# Patient Record
Sex: Female | Born: 1960
Health system: Southern US, Community
[De-identification: ages and names within clinical notes are randomized; demographics above are authoritative.]

## PROBLEM LIST (undated history)

## (undated) DIAGNOSIS — B009 Herpesviral infection, unspecified: Secondary | ICD-10-CM

## (undated) DIAGNOSIS — C44310 Basal cell carcinoma of skin of unspecified parts of face: Secondary | ICD-10-CM

## (undated) DIAGNOSIS — C4491 Basal cell carcinoma of skin, unspecified: Secondary | ICD-10-CM

## (undated) HISTORY — DX: Herpesviral infection, unspecified: B00.9

## (undated) HISTORY — DX: Basal cell carcinoma of skin, unspecified: C44.91

## (undated) HISTORY — DX: Basal cell carcinoma of skin of unspecified parts of face: C44.310

## (undated) HISTORY — PX: WISDOM TOOTH EXTRACTION: SHX21

## (undated) HISTORY — PX: TONSILLECTOMY AND ADENOIDECTOMY: SHX28

---

## 1997-09-24 ENCOUNTER — Ambulatory Visit (HOSPITAL_COMMUNITY): Admission: RE | Admit: 1997-09-24 | Discharge: 1997-09-24 | Payer: Self-pay | Admitting: Obstetrics and Gynecology

## 1999-04-06 ENCOUNTER — Other Ambulatory Visit: Admission: RE | Admit: 1999-04-06 | Discharge: 1999-04-06 | Payer: Self-pay | Admitting: Obstetrics and Gynecology

## 1999-10-07 ENCOUNTER — Encounter: Payer: Self-pay | Admitting: Obstetrics and Gynecology

## 1999-10-07 ENCOUNTER — Encounter: Admission: RE | Admit: 1999-10-07 | Discharge: 1999-10-07 | Payer: Self-pay | Admitting: Obstetrics and Gynecology

## 2000-04-28 ENCOUNTER — Other Ambulatory Visit: Admission: RE | Admit: 2000-04-28 | Discharge: 2000-04-28 | Payer: Self-pay | Admitting: Obstetrics and Gynecology

## 2000-10-12 ENCOUNTER — Other Ambulatory Visit: Admission: RE | Admit: 2000-10-12 | Discharge: 2000-10-12 | Payer: Self-pay | Admitting: Obstetrics and Gynecology

## 2001-05-05 ENCOUNTER — Other Ambulatory Visit: Admission: RE | Admit: 2001-05-05 | Discharge: 2001-05-05 | Payer: Self-pay | Admitting: Obstetrics and Gynecology

## 2001-08-16 ENCOUNTER — Encounter: Admission: RE | Admit: 2001-08-16 | Discharge: 2001-08-16 | Payer: Self-pay | Admitting: Obstetrics and Gynecology

## 2001-08-16 ENCOUNTER — Encounter: Payer: Self-pay | Admitting: Obstetrics and Gynecology

## 2002-05-23 ENCOUNTER — Other Ambulatory Visit: Admission: RE | Admit: 2002-05-23 | Discharge: 2002-05-23 | Payer: Self-pay | Admitting: Obstetrics and Gynecology

## 2002-05-31 ENCOUNTER — Encounter: Payer: Self-pay | Admitting: Obstetrics and Gynecology

## 2002-05-31 ENCOUNTER — Encounter: Admission: RE | Admit: 2002-05-31 | Discharge: 2002-05-31 | Payer: Self-pay | Admitting: Obstetrics and Gynecology

## 2003-06-04 ENCOUNTER — Other Ambulatory Visit: Admission: RE | Admit: 2003-06-04 | Discharge: 2003-06-04 | Payer: Self-pay | Admitting: Obstetrics and Gynecology

## 2003-06-18 ENCOUNTER — Encounter: Admission: RE | Admit: 2003-06-18 | Discharge: 2003-06-18 | Payer: Self-pay | Admitting: Obstetrics and Gynecology

## 2004-04-14 ENCOUNTER — Other Ambulatory Visit: Admission: RE | Admit: 2004-04-14 | Discharge: 2004-04-14 | Payer: Self-pay | Admitting: Obstetrics and Gynecology

## 2004-06-18 ENCOUNTER — Encounter: Admission: RE | Admit: 2004-06-18 | Discharge: 2004-06-18 | Payer: Self-pay | Admitting: Obstetrics and Gynecology

## 2004-07-03 ENCOUNTER — Encounter: Admission: RE | Admit: 2004-07-03 | Discharge: 2004-07-03 | Payer: Self-pay | Admitting: Obstetrics and Gynecology

## 2004-12-15 ENCOUNTER — Other Ambulatory Visit: Admission: RE | Admit: 2004-12-15 | Discharge: 2004-12-15 | Payer: Self-pay | Admitting: Obstetrics and Gynecology

## 2005-06-22 ENCOUNTER — Encounter: Admission: RE | Admit: 2005-06-22 | Discharge: 2005-06-22 | Payer: Self-pay | Admitting: *Deleted

## 2006-01-21 ENCOUNTER — Other Ambulatory Visit: Admission: RE | Admit: 2006-01-21 | Discharge: 2006-01-21 | Payer: Self-pay | Admitting: Obstetrics & Gynecology

## 2006-06-23 ENCOUNTER — Encounter: Admission: RE | Admit: 2006-06-23 | Discharge: 2006-06-23 | Payer: Self-pay | Admitting: Obstetrics and Gynecology

## 2006-09-27 HISTORY — PX: ENDOMETRIAL ABLATION: SHX621

## 2007-02-23 ENCOUNTER — Other Ambulatory Visit: Admission: RE | Admit: 2007-02-23 | Discharge: 2007-02-23 | Payer: Self-pay | Admitting: Obstetrics and Gynecology

## 2007-05-10 ENCOUNTER — Encounter: Admission: RE | Admit: 2007-05-10 | Discharge: 2007-05-10 | Payer: Self-pay | Admitting: Obstetrics and Gynecology

## 2008-03-14 ENCOUNTER — Other Ambulatory Visit: Admission: RE | Admit: 2008-03-14 | Discharge: 2008-03-14 | Payer: Self-pay | Admitting: Obstetrics and Gynecology

## 2008-05-10 ENCOUNTER — Encounter: Admission: RE | Admit: 2008-05-10 | Discharge: 2008-05-10 | Payer: Self-pay | Admitting: Obstetrics and Gynecology

## 2009-05-13 ENCOUNTER — Encounter: Admission: RE | Admit: 2009-05-13 | Discharge: 2009-05-13 | Payer: Self-pay | Admitting: Obstetrics and Gynecology

## 2010-04-30 ENCOUNTER — Encounter: Admission: RE | Admit: 2010-04-30 | Discharge: 2010-04-30 | Payer: Self-pay | Admitting: Obstetrics & Gynecology

## 2010-07-18 ENCOUNTER — Encounter: Payer: Self-pay | Admitting: Obstetrics and Gynecology

## 2011-03-31 ENCOUNTER — Other Ambulatory Visit: Payer: Self-pay | Admitting: Obstetrics & Gynecology

## 2011-03-31 DIAGNOSIS — Z1231 Encounter for screening mammogram for malignant neoplasm of breast: Secondary | ICD-10-CM

## 2011-05-03 ENCOUNTER — Ambulatory Visit
Admission: RE | Admit: 2011-05-03 | Discharge: 2011-05-03 | Disposition: A | Discharge status: Home / Self Care | Payer: 59 | Source: Ambulatory Visit | Attending: Obstetrics & Gynecology | Admitting: Obstetrics & Gynecology

## 2011-05-03 DIAGNOSIS — Z1231 Encounter for screening mammogram for malignant neoplasm of breast: Secondary | ICD-10-CM

## 2011-09-27 HISTORY — PX: MOHS SURGERY: SUR867

## 2011-10-17 ENCOUNTER — Ambulatory Visit (INDEPENDENT_AMBULATORY_CARE_PROVIDER_SITE_OTHER): Payer: 59 | Admitting: Family Medicine

## 2011-10-17 VITALS — BP 107/72 | HR 83 | Temp 98.5°F | Resp 16 | Ht 63.0 in | Wt 121.0 lb

## 2011-10-17 DIAGNOSIS — H1031 Unspecified acute conjunctivitis, right eye: Secondary | ICD-10-CM

## 2011-10-17 DIAGNOSIS — H66009 Acute suppurative otitis media without spontaneous rupture of ear drum, unspecified ear: Secondary | ICD-10-CM

## 2011-10-17 MED ORDER — TOBRAMYCIN 0.3 % OP SOLN: 1.0000 [drp] | Freq: Four times a day (QID) | OPHTHALMIC | Status: AC

## 2011-10-17 NOTE — Patient Instructions (Signed)
Conjunctivitis Conjunctivitis is commonly called "pink eye." Conjunctivitis can be caused by bacterial or viral infection, allergies, or injuries. There is usually redness of the lining of the eye, itching, discomfort, and sometimes discharge. There may be deposits of matter along the eyelids. A viral infection usually causes a watery discharge, while a bacterial infection causes a yellowish, thick discharge. Pink eye is very contagious and spreads by direct contact. You may be given antibiotic eyedrops as part of your treatment. Before using your eye medicine, remove all drainage from the eye by washing gently with warm water and cotton balls. Continue to use the medication until you have awakened 2 mornings in a row without discharge from the eye. Do not rub your eye. This increases the irritation and helps spread infection. Use separate towels from other household members. Wash your hands with soap and water before and after touching your eyes. Use cold compresses to reduce pain and sunglasses to relieve irritation from light. Do not wear contact lenses or wear eye makeup until the infection is gone. SEEK MEDICAL CARE IF:   Your symptoms are not better after 3 days of treatment.   You have increased pain or trouble seeing.   The outer eyelids become very red or swollen.  Document Released: 07/22/2004 Document Revised: 06/03/2011 Document Reviewed: 06/14/2005 ExitCare Patient Information 2012 [employer]. 

## 2011-10-17 NOTE — Progress Notes (Signed)
51 yo woman in [facility] with contact lens missing and gritty sensation in right eye.  O:  Injected right scleral vessels without perilimbal predominance No contact: lid everted. EOMI PERLA  A: conjunctivitis, OD  P: tobrex tid x 3-5 days OD

## 2011-10-20 LAB — HM PAP SMEAR: HM Pap smear: NEGATIVE

## 2012-04-03 ENCOUNTER — Other Ambulatory Visit: Payer: Self-pay | Admitting: Obstetrics & Gynecology

## 2012-04-03 DIAGNOSIS — Z1231 Encounter for screening mammogram for malignant neoplasm of breast: Secondary | ICD-10-CM

## 2012-05-05 ENCOUNTER — Ambulatory Visit
Admission: RE | Admit: 2012-05-05 | Discharge: 2012-05-05 | Disposition: A | Discharge status: Home / Self Care | Payer: 59 | Source: Ambulatory Visit | Attending: Obstetrics & Gynecology | Admitting: Obstetrics & Gynecology

## 2012-05-05 DIAGNOSIS — Z1231 Encounter for screening mammogram for malignant neoplasm of breast: Secondary | ICD-10-CM

## 2012-11-08 ENCOUNTER — Ambulatory Visit: Payer: 59 | Admitting: Obstetrics & Gynecology

## 2012-11-10 ENCOUNTER — Encounter: Payer: Self-pay | Admitting: Obstetrics & Gynecology

## 2012-11-10 ENCOUNTER — Ambulatory Visit (INDEPENDENT_AMBULATORY_CARE_PROVIDER_SITE_OTHER): Payer: 59 | Admitting: Obstetrics & Gynecology

## 2012-11-10 VITALS — BP 110/64 | HR 76 | Ht 63.25 in | Wt 117.0 lb

## 2012-11-10 DIAGNOSIS — N951 Menopausal and female climacteric states: Secondary | ICD-10-CM

## 2012-11-10 DIAGNOSIS — Z0000 Encounter for general adult medical examination without abnormal findings: Secondary | ICD-10-CM

## 2012-11-10 DIAGNOSIS — Z01419 Encounter for gynecological examination (general) (routine) without abnormal findings: Secondary | ICD-10-CM

## 2012-11-10 LAB — POCT URINALYSIS DIPSTICK
Bilirubin, UA: NEGATIVE
Blood, UA: NEGATIVE
Glucose, UA: NEGATIVE
Ketones, UA: NEGATIVE
Leukocytes, UA: NEGATIVE
Nitrite, UA: NEGATIVE
Protein, UA: NEGATIVE
Urobilinogen, UA: NEGATIVE
pH, UA: 7

## 2012-11-10 LAB — FOLLICLE STIMULATING HORMONE: FSH: 12.4 m[IU]/mL

## 2012-11-10 NOTE — Patient Instructions (Signed)

## 2012-11-10 NOTE — Progress Notes (Signed)
Patient ID: Christina Wallace, female   DOB: 1961-02-01, 52 y.o.   MRN: [id number]  52 y.o. [id number] MarriedCaucasianF here for annual exam.  Not happy with her OCPs.  Frustrated with cost.  Hospital doctor from Loestrin 24 to [facility] [facility].  Having more trouble with breast cyst on Minastrin.  Has been on this six months.  No vaginal bleeding just frustrating spotting.  No LMP recorded. Patient has had an ablation.          Sexually active: yes  The current method of family planning is vasectomy.    Exercising: yes  walking/elliptical Smoker:  no  Health Maintenance: Pap:  10/20/11 History of abnormal Pap:  yes MMG:  05/05/12, 3D Colonoscopy:  11/06/12, negative with Dr. Loreta Ave, f/u 10 yrs BMD:   never TDaP:  ? 2010 Screening Labs: Dr. Loreta Ave, Hb today: Dr. Loreta Ave, Urine today: negative    reports that she has never smoked. She has never used smokeless tobacco. She reports that she drinks about 1.8 ounces of alcohol per week. She reports that she does not use illicit drugs.  Past Medical History  Diagnosis Date  . BCC (basal cell carcinoma), face     forehead, stomach, legs, shoulders, arm    Past Surgical History  Procedure Laterality Date  . Endometrial ablation  4/08  . Tonsillectomy and adenoidectomy      Current Outpatient Prescriptions  Medication Sig Dispense Refill  . norethindrone-ethinyl estradiol (JUNEL FE,GILDESS FE,LOESTRIN FE) 1-20 MG-MCG tablet Take 1 tablet by mouth daily.      . Calcium Carbonate-Vitamin D (CALCIUM + D PO) Take 1 tablet by mouth daily.      . cetirizine (ZYRTEC) 10 MG tablet Take 10 mg by mouth daily.       No current facility-administered medications for this visit.    Family History  Problem Relation Age of Onset  . Breast cancer Mother 62  . Diabetes Mother     type II, AODM  . Colon polyps Father   . Thyroid disease Father   . Hypertension Father     ROS:  Pertinent items are noted in HPI.  Otherwise, a comprehensive ROS was negative.  Exam:   BP 110/64   Pulse 76  Ht 5' 3.25" (1.607 m)  Wt 117 lb (53.071 kg)  BMI 20.55 kg/m2  Weight change: -3lbs Height:   Height: 5' 3.25" (160.7 cm)  Ht Readings from Last 3 Encounters:  11/10/12 5' 3.25" (1.607 m)  10/17/11 5\' 3"  (1.6 m)    General appearance: alert, cooperative and appears stated age Head: Normocephalic, without obvious abnormality, atraumatic Neck: no adenopathy, supple, symmetrical, trachea midline and thyroid normal to inspection and palpation Lungs: clear to auscultation bilaterally Breasts: normal appearance, no masses or tenderness Heart: regular rate and rhythm Abdomen: soft, non-tender; bowel sounds normal; no masses,  no organomegaly Extremities: extremities normal, atraumatic, no cyanosis or edema Skin: Skin color, texture, turgor normal. No rashes or lesions Lymph nodes: Cervical, supraclavicular, and axillary nodes normal. No abnormal inguinal nodes palpated Neurologic: Grossly normal   Pelvic: External genitalia:  no lesions              Urethra:  normal appearing urethra with no masses, tenderness or lesions              Bartholins and Skenes: normal                 Vagina: normal appearing vagina with normal color and discharge, no lesions  Cervix: no lesions              Pap taken: no Bimanual Exam:  Uterus:  normal size, contour, position, consistency, mobility, non-tender              Adnexa: normal adnexa and no mass, fullness, tenderness               Rectovaginal: Confirms               Anus:  normal sphincter tone, no lesions  A:  Well Woman with normal exam BCC H/O breast cysts DUB, was better on Loestrin 24 but not on Minastrin  P:   Mammogram yearly.  3D MMG discussed pap smear with neg HR HPV 4/13.  No Pap today FSH today.  If low, will change OCPs to Loestrin 1/20 return annually or prn  An After Visit Summary was printed and given to the patient.

## 2012-11-13 ENCOUNTER — Other Ambulatory Visit: Payer: Self-pay

## 2012-11-13 MED ORDER — NORETHINDRONE ACET-ETHINYL EST 1-20 MG-MCG PO TABS: 1.0000 | ORAL_TABLET | Freq: Every day | ORAL | Status: DC

## 2013-04-06 ENCOUNTER — Other Ambulatory Visit: Payer: Self-pay

## 2013-04-06 DIAGNOSIS — Z1231 Encounter for screening mammogram for malignant neoplasm of breast: Secondary | ICD-10-CM

## 2013-05-03 ENCOUNTER — Other Ambulatory Visit: Payer: Self-pay

## 2013-05-07 ENCOUNTER — Ambulatory Visit
Admission: RE | Admit: 2013-05-07 | Discharge: 2013-05-07 | Disposition: A | Discharge status: Home / Self Care | Payer: Self-pay | Source: Ambulatory Visit

## 2013-05-07 DIAGNOSIS — Z1231 Encounter for screening mammogram for malignant neoplasm of breast: Secondary | ICD-10-CM

## 2013-05-10 ENCOUNTER — Encounter: Payer: Self-pay | Admitting: Obstetrics & Gynecology

## 2013-05-11 NOTE — Telephone Encounter (Signed)
05/11/13 lmtcb//kn

## 2013-05-11 NOTE — Telephone Encounter (Signed)
Message copied by Elisha Headland on Fri May 11, 2013  4:45 PM ------      Message from: Jerene Bears      Created: Fri May 11, 2013  3:55 PM       Daughter's name is Annabelle Harman.  Number is [phone] ------

## 2013-10-08 ENCOUNTER — Encounter: Payer: Self-pay | Admitting: Obstetrics & Gynecology

## 2013-10-08 MED ORDER — NORETHIN ACE-ETH ESTRAD-FE 1-20 MG-MCG PO TABS: 1.0000 | ORAL_TABLET | Freq: Every day | ORAL | Status: DC

## 2013-10-08 NOTE — Telephone Encounter (Signed)
Rx for RF after MyChart message was sent by patient.

## 2013-11-23 ENCOUNTER — Ambulatory Visit (INDEPENDENT_AMBULATORY_CARE_PROVIDER_SITE_OTHER): Payer: 59 | Admitting: Obstetrics & Gynecology

## 2013-11-23 ENCOUNTER — Encounter: Payer: Self-pay | Admitting: Obstetrics & Gynecology

## 2013-11-23 ENCOUNTER — Telehealth: Payer: Self-pay | Admitting: Obstetrics & Gynecology

## 2013-11-23 VITALS — BP 100/60 | HR 64 | Resp 16 | Ht 63.5 in | Wt 119.2 lb

## 2013-11-23 DIAGNOSIS — N8000 Endometriosis of the uterus, unspecified: Secondary | ICD-10-CM

## 2013-11-23 DIAGNOSIS — N809 Endometriosis, unspecified: Secondary | ICD-10-CM

## 2013-11-23 DIAGNOSIS — N8003 Adenomyosis of the uterus: Secondary | ICD-10-CM

## 2013-11-23 DIAGNOSIS — R6889 Other general symptoms and signs: Secondary | ICD-10-CM

## 2013-11-23 DIAGNOSIS — Z124 Encounter for screening for malignant neoplasm of cervix: Secondary | ICD-10-CM

## 2013-11-23 DIAGNOSIS — Z01419 Encounter for gynecological examination (general) (routine) without abnormal findings: Secondary | ICD-10-CM

## 2013-11-23 DIAGNOSIS — Z0000 Encounter for general adult medical examination without abnormal findings: Secondary | ICD-10-CM

## 2013-11-23 DIAGNOSIS — R899 Unspecified abnormal finding in specimens from other organs, systems and tissues: Secondary | ICD-10-CM

## 2013-11-23 LAB — HEMOGLOBIN, FINGERSTICK: Hemoglobin, fingerstick: 13.1 g/dL (ref 12.0–16.0)

## 2013-11-23 LAB — POCT URINALYSIS DIPSTICK
Bilirubin, UA: NEGATIVE
Blood, UA: NEGATIVE
Glucose, UA: NEGATIVE
Ketones, UA: NEGATIVE
Nitrite, UA: NEGATIVE
Protein, UA: NEGATIVE
Urobilinogen, UA: NEGATIVE
pH, UA: 6

## 2013-11-23 MED ORDER — CLINDAMYCIN PHOSPHATE 2 % VA CREA: TOPICAL_CREAM | VAGINAL | Status: DC

## 2013-11-23 MED ORDER — NORETHIN ACE-ETH ESTRAD-FE 1-20 MG-MCG PO TABS: 1.0000 | ORAL_TABLET | Freq: Every day | ORAL | Status: DC

## 2013-11-23 NOTE — Telephone Encounter (Signed)
Patient is calling sabrina back °

## 2013-11-23 NOTE — Progress Notes (Signed)
53 y.o. [name] MarriedCaucasianF here for annual exam.  Planning on another trip to see [place] this [place].  Trying to see them all with her kids.  Doing well.  Still on OCPs for cycle control.  Bleeds very rarely.  Is irregular.    Pt with chronic issues with vulvar irritation.  Had vulvar biopsy 7/06.  Negative for abnormal cells.  Only thing that has every helped is Clindamycin cream.  Would like to try again.    Patient's last menstrual period was 08/26/2013.          Sexually active: yes  The current method of family planning is vasectomy and OCP (estrogen/progesterone).    Exercising: yes   Smoker:  no  Health Maintenance: Pap:  10/20/11 WNL/negative HR HPV History of abnormal Pap:  yes MMG:  05/07/13-normal Colonoscopy:  2014-repeat in 10 years BMD:   none TDaP:  2010 Screening Labs: today, Hb today: 13.1, Urine today: WBC-trace, PH-6.0   reports that she has never smoked. She has never used smokeless tobacco. She reports that she drinks about 3 ounces of alcohol per week. She reports that she does not use illicit drugs.  Past Medical History  Diagnosis Date  . BCC (basal cell carcinoma), face     forehead, stomach, legs, shoulders, arm  . BCC (basal cell carcinoma)     several more areas removed    Past Surgical History  Procedure Laterality Date  . Endometrial ablation  4/08  . Tonsillectomy and adenoidectomy    . Mohs surgery  4/13    BCC on forehead  . Cesarean section  1/91  . Wisdom tooth extraction      Current Outpatient Prescriptions  Medication Sig Dispense Refill  . Cholecalciferol (VITAMIN D PO) Take by mouth daily.      . norethindrone-ethinyl estradiol (JUNEL FE,GILDESS FE,LOESTRIN FE) 1-20 MG-MCG tablet Take 1 tablet by mouth daily.  1 Package  2  . cetirizine (ZYRTEC) 10 MG tablet Take 10 mg by mouth daily.       No current facility-administered medications for this visit.    Family History  Problem Relation Age of Onset  . Breast cancer  Mother 30  . Diabetes Mother     type II, AODM  . Colon polyps Father   . Thyroid disease Father   . Hypertension Father     ROS:  Pertinent items are noted in HPI.  Otherwise, a comprehensive ROS was negative.  Exam:   BP 100/60  Pulse 64  Resp 16  Ht 5' 3.5" (1.613 m)  Wt 119 lb 3.2 oz (54.069 kg)  BMI 20.78 kg/m2  LMP 08/26/2013   Height: 5' 3.5" (161.3 cm)  Ht Readings from Last 3 Encounters:  11/23/13 5' 3.5" (1.613 m)  11/10/12 5' 3.25" (1.607 m)  10/17/11 5\' 3"  (1.6 m)    General appearance: alert, cooperative and appears stated age Head: Normocephalic, without obvious abnormality, atraumatic Neck: no adenopathy, supple, symmetrical, trachea midline and thyroid normal to inspection and palpation Lungs: clear to auscultation bilaterally Breasts: normal appearance, no masses or tenderness Heart: regular rate and rhythm Abdomen: soft, non-tender; bowel sounds normal; no masses,  no organomegaly Extremities: extremities normal, atraumatic, no cyanosis or edema Skin: Skin color, texture, turgor normal. No rashes or lesions Lymph nodes: Cervical, supraclavicular, and axillary nodes normal. No abnormal inguinal nodes palpated Neurologic: Grossly normal   Pelvic: External genitalia:  no lesions  Urethra:  normal appearing urethra with no masses, tenderness or lesions              Bartholins and Skenes: normal                 Vagina: normal appearing vagina with normal color and discharge, no lesions              Cervix: no lesions              Pap taken: yes Bimanual Exam:  Uterus:  normal size, contour, position, consistency, mobility, non-tender              Adnexa: normal adnexa and no mass, fullness, tenderness               Rectovaginal: Confirms               Anus:  normal sphincter tone, no lesions  A:  Well Woman with normal exam Low Vit D Failed endometrial ablation H/O BCC.  Seeing derm every 6 months DUB  P:   Mammogram yearly.  D/W pt  doing 3D MMG yearly. pap smear only.  Neg HR HPV 4/13 with neg Pap. Trial with clindamycin cream topically.  Rx to pharmacy.  Pt to call and give update in 10-14 days. Rx for Alesse #3 mo supply/4RF Plan PUS.  If endometrium thickened, do endometrial biopsy return annually or prn  An After Visit Summary was printed and given to the patient.

## 2013-11-23 NOTE — Patient Instructions (Signed)

## 2013-11-23 NOTE — Telephone Encounter (Signed)
Left message for patient to call back. Need to go over benefits and schedule PUS °

## 2013-11-23 NOTE — Telephone Encounter (Signed)
Spoke with patient. Advised that per benefit quote received, she will be responsible for $20 copay for PUS. Scheduled PUS. Advised patient of 72 hour cancellation policy and $594 cancellation fee. Patient agreeable.

## 2013-11-24 LAB — TSH: TSH: 4.536 u[IU]/mL — ABNORMAL HIGH (ref 0.350–4.500)

## 2013-11-24 LAB — COMPREHENSIVE METABOLIC PANEL
ALT: 12 U/L (ref 0–35)
AST: 16 U/L (ref 0–37)
Albumin: 4.2 g/dL (ref 3.5–5.2)
Alkaline Phosphatase: 44 U/L (ref 39–117)
BUN: 12 mg/dL (ref 6–23)
CO2: 26 mEq/L (ref 19–32)
Calcium: 9.4 mg/dL (ref 8.4–10.5)
Chloride: 104 mEq/L (ref 96–112)
Creat: 0.68 mg/dL (ref 0.50–1.10)
Glucose, Bld: 79 mg/dL (ref 70–99)
Potassium: 4.5 mEq/L (ref 3.5–5.3)
Sodium: 138 mEq/L (ref 135–145)
Total Bilirubin: 0.5 mg/dL (ref 0.2–1.2)
Total Protein: 6.5 g/dL (ref 6.0–8.3)

## 2013-11-24 LAB — LIPID PANEL
Cholesterol: 161 mg/dL (ref 0–200)
HDL: 61 mg/dL (ref >39)
LDL Cholesterol: 87 mg/dL (ref 0–99)
Total CHOL/HDL Ratio: 2.6 Ratio
Triglycerides: 67 mg/dL (ref <150)
VLDL: 13 mg/dL (ref 0–40)

## 2013-11-24 LAB — VITAMIN D 25 HYDROXY (VIT D DEFICIENCY, FRACTURES): Vit D, 25-Hydroxy: 45 ng/mL (ref 30–89)

## 2013-11-25 ENCOUNTER — Encounter: Payer: Self-pay | Admitting: Obstetrics & Gynecology

## 2013-11-27 ENCOUNTER — Telehealth: Payer: Self-pay

## 2013-11-27 LAB — IPS PAP SMEAR ONLY

## 2013-11-27 NOTE — Telephone Encounter (Signed)
Message copied by Robley Fries on Tue Nov 27, 2013  8:46 AM ------      Message from: Megan Salon      Created: Tue Nov 27, 2013  7:28 AM       Can you call pt and make sure she has a TSH scheduled?  Thanks.            Mrs. Mac,      Your lipids are find.  Your metabolic panel is normal--kidney and liver function, glucose, and electrolytes.  Your Vit D is normal.  Your thyroid test is mildly elevated.  It should be repeated in a month.  My nurse, Claiborne Billings, will call you about this.            Dr. Sabra Heck ------

## 2013-11-27 NOTE — Addendum Note (Signed)
Addended by: Megan Salon on: 11/27/2013 07:29 AM   Modules accepted: Orders

## 2013-11-27 NOTE — Telephone Encounter (Signed)
Left detailed message for patient to call back to discuss labs and make f/u appointment. She does have [place] with Dr  Sabra Heck on 11/29/13 and can scheduled then if she would like//kn

## 2013-11-29 ENCOUNTER — Ambulatory Visit (INDEPENDENT_AMBULATORY_CARE_PROVIDER_SITE_OTHER): Payer: 59 | Admitting: Obstetrics & Gynecology

## 2013-11-29 ENCOUNTER — Ambulatory Visit (INDEPENDENT_AMBULATORY_CARE_PROVIDER_SITE_OTHER): Payer: 59

## 2013-11-29 VITALS — BP 118/78 | Ht 63.5 in | Wt 119.0 lb

## 2013-11-29 DIAGNOSIS — N925 Other specified irregular menstruation: Secondary | ICD-10-CM

## 2013-11-29 DIAGNOSIS — D259 Leiomyoma of uterus, unspecified: Secondary | ICD-10-CM

## 2013-11-29 DIAGNOSIS — N8000 Endometriosis of the uterus, unspecified: Secondary | ICD-10-CM

## 2013-11-29 DIAGNOSIS — D219 Benign neoplasm of connective and other soft tissue, unspecified: Secondary | ICD-10-CM

## 2013-11-29 DIAGNOSIS — N938 Other specified abnormal uterine and vaginal bleeding: Secondary | ICD-10-CM

## 2013-11-29 DIAGNOSIS — N8003 Adenomyosis of the uterus: Secondary | ICD-10-CM

## 2013-11-29 DIAGNOSIS — N809 Endometriosis, unspecified: Principal | ICD-10-CM

## 2013-11-29 DIAGNOSIS — N83209 Unspecified ovarian cyst, unspecified side: Secondary | ICD-10-CM

## 2013-11-29 DIAGNOSIS — N949 Unspecified condition associated with female genital organs and menstrual cycle: Secondary | ICD-10-CM

## 2013-11-29 NOTE — Progress Notes (Signed)
53 y.o. Christina Wallace Marriedfemale here for a pelvic ultrasound due to DUB in pt with hx of failed endometrial ablation.  She is on OCPs and does bleed from time to time.  This is not always when she is on placebo pills.  It is usually light when she does bleed.  Pt has been contemplating hysterectomy.  She does want her ovaries removed so doesn't want to do it now if possible.  We have talked about this several times over the past few years.  Patient's last menstrual period was 08/26/2013.  Sexually active:  yes  Contraception: oral contraceptives (estrogen/progesterone)  FINDINGS: UTERUS:  7.4 x 5.6 x 3.5cm with 1.0cm intramural fibroid, posterior EMS: 3.104mm, slightly distored ADNEXA:   Left ovary 3.0 x 2.6 x 1.7cm   Right ovary 2.2 x 1.6 x 1.5cm with 1.9 x 1.7 x 2.0cm cyst which appears to be a corpus luteal cyst CUL DE SAC: no free fluid  Findings reviewed with patient.  Images reviewed as well.  Pt not interested in proceeding with any surgery right now.  All questions answered.  Assessment:  DUB, probable adenomyosis, corpus luteal cyst, slightly irregular endometrium with hx of endometrial ablation Plan: Repeat physical exam next year.  Pt to call with any changes in bleeding.  Pt voices understanding.  ~15 minutes spent with patient >50% of time was in face to face discussion of above.

## 2013-12-05 ENCOUNTER — Encounter: Payer: Self-pay | Admitting: Obstetrics & Gynecology

## 2013-12-05 DIAGNOSIS — N938 Other specified abnormal uterine and vaginal bleeding: Secondary | ICD-10-CM | POA: Insufficient documentation

## 2013-12-10 NOTE — Telephone Encounter (Signed)
Patient has recheck lab appointment//kn

## 2013-12-31 ENCOUNTER — Other Ambulatory Visit (INDEPENDENT_AMBULATORY_CARE_PROVIDER_SITE_OTHER): Payer: 59

## 2013-12-31 DIAGNOSIS — R6889 Other general symptoms and signs: Secondary | ICD-10-CM

## 2013-12-31 DIAGNOSIS — R899 Unspecified abnormal finding in specimens from other organs, systems and tissues: Secondary | ICD-10-CM

## 2014-01-01 LAB — TSH: TSH: 3.616 u[IU]/mL (ref 0.350–4.500)

## 2014-04-15 ENCOUNTER — Other Ambulatory Visit: Payer: Self-pay

## 2014-04-15 DIAGNOSIS — Z1239 Encounter for other screening for malignant neoplasm of breast: Secondary | ICD-10-CM

## 2014-04-29 ENCOUNTER — Encounter: Payer: Self-pay | Admitting: Obstetrics & Gynecology

## 2014-05-10 ENCOUNTER — Other Ambulatory Visit: Payer: Self-pay

## 2014-05-10 ENCOUNTER — Ambulatory Visit
Admission: RE | Admit: 2014-05-10 | Discharge: 2014-05-10 | Disposition: A | Discharge status: Home / Self Care | Payer: 59 | Source: Ambulatory Visit

## 2014-05-10 DIAGNOSIS — Z1231 Encounter for screening mammogram for malignant neoplasm of breast: Secondary | ICD-10-CM

## 2014-09-09 ENCOUNTER — Encounter: Payer: Self-pay | Admitting: Obstetrics & Gynecology

## 2014-09-16 ENCOUNTER — Encounter: Payer: Self-pay | Admitting: Obstetrics & Gynecology

## 2014-11-25 ENCOUNTER — Other Ambulatory Visit: Payer: Self-pay | Admitting: Obstetrics & Gynecology

## 2014-11-26 NOTE — Telephone Encounter (Signed)
Medication refill request: Junel  Last AEX:  11/23/13  Next AEX: 12/02/14 SM Last MMG (if hormonal medication request): 05/10/14 BIRADS1:Neg Refill authorized: 11/23/13 #3packs w/ 4R. Today #1pack w/0R?

## 2014-12-02 ENCOUNTER — Ambulatory Visit (INDEPENDENT_AMBULATORY_CARE_PROVIDER_SITE_OTHER): Payer: 59 | Admitting: Obstetrics & Gynecology

## 2014-12-02 ENCOUNTER — Encounter: Payer: Self-pay | Admitting: Obstetrics & Gynecology

## 2014-12-02 VITALS — BP 110/70 | HR 84 | Resp 14 | Ht 63.25 in | Wt 120.0 lb

## 2014-12-02 DIAGNOSIS — Z01419 Encounter for gynecological examination (general) (routine) without abnormal findings: Secondary | ICD-10-CM

## 2014-12-02 DIAGNOSIS — N951 Menopausal and female climacteric states: Secondary | ICD-10-CM | POA: Diagnosis not present

## 2014-12-02 DIAGNOSIS — Z0000 Encounter for general adult medical examination without abnormal findings: Secondary | ICD-10-CM

## 2014-12-02 LAB — POCT URINALYSIS DIPSTICK
Bilirubin, UA: NEGATIVE
Blood, UA: NEGATIVE
Glucose, UA: NEGATIVE
Ketones, UA: NEGATIVE
Leukocytes, UA: NEGATIVE
Nitrite, UA: NEGATIVE
Protein, UA: NEGATIVE
Spec Grav, UA: 1.02
Urobilinogen, UA: NEGATIVE
pH, UA: 6.5

## 2014-12-02 LAB — HEMOGLOBIN, FINGERSTICK: Hemoglobin, fingerstick: 13.3 g/dL (ref 12.0–16.0)

## 2014-12-02 LAB — TSH: TSH: 3.881 u[IU]/mL (ref 0.350–4.500)

## 2014-12-02 MED ORDER — NORETHIN ACE-ETH ESTRAD-FE 1-20 MG-MCG PO TABS: 1.0000 | ORAL_TABLET | Freq: Every day | ORAL | Status: DC

## 2014-12-02 NOTE — Progress Notes (Signed)
Patient ID: Christina Wallace, female   DOB: 1960-07-21, 54 y.o.   MRN: [id number]   54 y.o. [id number] MarriedCaucasianF here for annual exam.  Has minimal spotting about every six months now.  Planning on testing [facility] today.    Dermatologist:  Planning on seeing new one in [place].  Going every six months.    Daughter getting married in September.  PCP:  None   No LMP recorded. Patient has had an ablation.          Sexually active: Yes.    The current method of family planning is vasectomy and ablation.    Exercising: Yes.    walking and eltipitcal Smoker:  no  Health Maintenance: Pap:  11-23-13 WNL, 2013 neg HR HPV History of abnormal Pap:  Yes  MMG:  05-10-14 WNL BI RADS 1 Colonoscopy:  11-06-12 WNL Repeat in 10 yrs BMD:   Never TDaP:  06-28-08 Screening Labs: Labs drawn today, Hb today: 13.3, Urine today: WNL   reports that she has never smoked. She has never used smokeless tobacco. She reports that she drinks about 3.0 oz of alcohol per week. She reports that she does not use illicit drugs.  Past Medical History  Diagnosis Date  . BCC (basal cell carcinoma), face     forehead, stomach, legs, shoulders, arm  . BCC (basal cell carcinoma)     several more areas removed  . HSV-1 infection     Past Surgical History  Procedure Laterality Date  . Endometrial ablation  4/08  . Tonsillectomy and adenoidectomy    . Mohs surgery  4/13    BCC on forehead  . Cesarean section  1/91  . Wisdom tooth extraction      Current Outpatient Prescriptions  Medication Sig Dispense Refill  . Cholecalciferol (VITAMIN D PO) Take by mouth daily.    [name] FE 1/20 1-20 MG-MCG tablet TAKE 1 TABLET BY MOUTH DAILY. 28 tablet 0   No current facility-administered medications for this visit.    Family History  Problem Relation Age of Onset  . Breast cancer Mother 71  . Diabetes Mother     type II, AODM  . Colon polyps Father   . Thyroid disease Father   . Hypertension Father   . Stroke  Father     ROS:  Pertinent items are noted in HPI.  Otherwise, a comprehensive ROS was negative.  Exam:   BP 110/70 mmHg  Pulse 84  Resp 14  Ht 5' 3.25" (1.607 m)  Wt 120 lb (54.432 kg)  BMI 21.08 kg/m2  Weight change: -1#  Height: 5' 3.25" (160.7 cm)  Ht Readings from Last 3 Encounters:  12/02/14 5' 3.25" (1.607 m)  11/29/13 5' 3.5" (1.613 m)  11/23/13 5' 3.5" (1.613 m)    General appearance: alert, cooperative and appears stated age Head: Normocephalic, without obvious abnormality, atraumatic Neck: no adenopathy, supple, symmetrical, trachea midline and thyroid normal to inspection and palpation Lungs: clear to auscultation bilaterally Breasts: normal appearance, no masses or tenderness Heart: regular rate and rhythm Abdomen: soft, non-tender; bowel sounds normal; no masses,  no organomegaly Extremities: extremities normal, atraumatic, no cyanosis or edema Skin: Skin color, texture, turgor normal. No rashes or lesions Lymph nodes: Cervical, supraclavicular, and axillary nodes normal. No abnormal inguinal nodes palpated Neurologic: Grossly normal   Pelvic: External genitalia:  no lesions              Urethra:  normal appearing urethra with no masses, tenderness  or lesions              Bartholins and Skenes: normal                 Vagina: normal appearing vagina with normal color and discharge, no lesions              Cervix: no lesions              Pap taken: No. Bimanual Exam:  Uterus:  normal size, contour, position, consistency, mobility, non-tender              Adnexa: normal adnexa and no mass, fullness, tenderness               Rectovaginal: Confirms               Anus:  normal sphincter tone, no lesions  Chaperone was present for exam.  A:  Well Woman with normal exam Low Vit D Failed endometrial ablation, improved bleeding over last few years H/O BCC. Seeing derm every 6 months DUB Small uterine fibroid  P: Mammogram yearly. D/W pt doing 3D MMG  yearly. No pap today.  Neg pap 2015.  Neg HR HPV 4/13 with neg Pap. Rx for Alesse #3 mo supply/4RF.  Will stop if menopausal per blood testing. TSH and FSH return annually or prn

## 2014-12-03 LAB — FOLLICLE STIMULATING HORMONE: FSH: 7.6 m[IU]/mL

## 2015-01-01 ENCOUNTER — Encounter: Payer: Self-pay | Admitting: Obstetrics & Gynecology

## 2015-01-02 NOTE — Telephone Encounter (Signed)
Question answered via mychart.  Routing to provider for final review. Patient agreeable to disposition. Will close encounter.

## 2015-04-07 ENCOUNTER — Other Ambulatory Visit: Payer: Self-pay

## 2015-04-07 DIAGNOSIS — Z1231 Encounter for screening mammogram for malignant neoplasm of breast: Secondary | ICD-10-CM

## 2015-05-12 ENCOUNTER — Ambulatory Visit
Admission: RE | Admit: 2015-05-12 | Discharge: 2015-05-12 | Disposition: A | Discharge status: Home / Self Care | Payer: Commercial Managed Care - HMO | Source: Ambulatory Visit

## 2015-05-12 DIAGNOSIS — Z1231 Encounter for screening mammogram for malignant neoplasm of breast: Secondary | ICD-10-CM

## 2016-02-12 ENCOUNTER — Other Ambulatory Visit: Payer: Self-pay | Admitting: Obstetrics & Gynecology

## 2016-02-12 NOTE — Telephone Encounter (Signed)
Medication refill request: JUNEL FE 1/20 Last AEX:  12/02/14 SM Next AEX: 02/27/16  Last MMG (if hormonal medication request): 05/12/15 BIRADS1 negative Refill authorized: 12/02/14 #3packs w/4refills; today #3packs w/0 refills? Please advise

## 2016-02-27 ENCOUNTER — Ambulatory Visit (INDEPENDENT_AMBULATORY_CARE_PROVIDER_SITE_OTHER): Payer: Commercial Managed Care - HMO | Admitting: Obstetrics & Gynecology

## 2016-02-27 ENCOUNTER — Encounter: Payer: Self-pay | Admitting: Obstetrics & Gynecology

## 2016-02-27 VITALS — BP 112/68 | HR 70 | Resp 16 | Ht 63.25 in | Wt 119.0 lb

## 2016-02-27 DIAGNOSIS — Z205 Contact with and (suspected) exposure to viral hepatitis: Secondary | ICD-10-CM | POA: Diagnosis not present

## 2016-02-27 DIAGNOSIS — Z0000 Encounter for general adult medical examination without abnormal findings: Secondary | ICD-10-CM

## 2016-02-27 DIAGNOSIS — Z124 Encounter for screening for malignant neoplasm of cervix: Secondary | ICD-10-CM | POA: Diagnosis not present

## 2016-02-27 DIAGNOSIS — Z01419 Encounter for gynecological examination (general) (routine) without abnormal findings: Secondary | ICD-10-CM

## 2016-02-27 LAB — POCT URINALYSIS DIPSTICK
Bilirubin, UA: NEGATIVE
Blood, UA: NEGATIVE
Glucose, UA: NEGATIVE
Ketones, UA: NEGATIVE
Leukocytes, UA: NEGATIVE
Nitrite, UA: NEGATIVE
Protein, UA: NEGATIVE
Urobilinogen, UA: NEGATIVE
pH, UA: 5

## 2016-02-27 LAB — COMPREHENSIVE METABOLIC PANEL
ALT: 14 U/L (ref 6–29)
AST: 20 U/L (ref 10–35)
Albumin: 4.9 g/dL (ref 3.6–5.1)
Alkaline Phosphatase: 51 U/L (ref 33–130)
BUN: 12 mg/dL (ref 7–25)
CO2: 28 mmol/L (ref 20–31)
Calcium: 9.5 mg/dL (ref 8.6–10.4)
Chloride: 102 mmol/L (ref 98–110)
Creat: 0.75 mg/dL (ref 0.50–1.05)
Glucose, Bld: 98 mg/dL (ref 65–99)
Potassium: 4.6 mmol/L (ref 3.5–5.3)
Sodium: 141 mmol/L (ref 135–146)
Total Bilirubin: 0.6 mg/dL (ref 0.2–1.2)
Total Protein: 7.1 g/dL (ref 6.1–8.1)

## 2016-02-27 LAB — CBC
HCT: 42.8 % (ref 35.0–45.0)
Hemoglobin: 14.2 g/dL (ref 11.7–15.5)
MCH: 31 pg (ref 27.0–33.0)
MCHC: 33.2 g/dL (ref 32.0–36.0)
MCV: 93.4 fL (ref 80.0–100.0)
MPV: 10 fL (ref 7.5–12.5)
Platelets: 234 10*3/uL (ref 140–400)
RBC: 4.58 MIL/uL (ref 3.80–5.10)
RDW: 13.4 % (ref 11.0–15.0)
WBC: 6.2 10*3/uL (ref 3.8–10.8)

## 2016-02-27 LAB — LIPID PANEL
Cholesterol: 186 mg/dL (ref 125–200)
HDL: 90 mg/dL (ref >=46)
LDL Cholesterol: 75 mg/dL (ref <130)
Total CHOL/HDL Ratio: 2.1 Ratio (ref <=5.0)
Triglycerides: 103 mg/dL (ref <150)
VLDL: 21 mg/dL (ref <30)

## 2016-02-27 LAB — HEPATITIS C ANTIBODY: HCV Ab: NEGATIVE

## 2016-02-27 MED ORDER — NORETHIN ACE-ETH ESTRAD-FE 1-20 MG-MCG PO TABS: 1.0000 | ORAL_TABLET | Freq: Every day | ORAL | 0 refills | Status: DC

## 2016-02-27 NOTE — Progress Notes (Signed)
55 y.o. [id number] MarriedCaucasianF here for annual exam.  Doing well.  Has rare spotting.  H/O failed endometrial ablation.  Still feels "hormonal" at times.  No LMP recorded. Patient has had an ablation.          Sexually active: Yes.    The current method of family planning is vasectomy.    Exercising: Yes.    elliptical, treamill & weights Smoker:  no  Health Maintenance: Pap:  11-23-13 neg  History of abnormal Pap:  yes MMG:  05-12-15 category c density birads 1:neg Colonoscopy:  11-06-12 neg f/u 33yrs BMD:   none TDaP:  2010 Pneumonia vaccine(s):  Not done Zostavax:   Not done Hep C testing: today Screening Labs: obtained today, Hb today: 14.2, Urine today: neg Self breast exam: not done   reports that she has never smoked. She has never used smokeless tobacco. She reports that she drinks about 3.0 oz of alcohol per week . She reports that she does not use drugs.  Past Medical History:  Diagnosis Date  . BCC (basal cell carcinoma)    several more areas removed  . BCC (basal cell carcinoma), face    forehead, stomach, legs, shoulders, arm  . HSV-1 infection     Past Surgical History:  Procedure Laterality Date  . CESAREAN SECTION  1/91  . ENDOMETRIAL ABLATION  4/08  . MOHS SURGERY  4/13   BCC on forehead  . TONSILLECTOMY AND ADENOIDECTOMY    . WISDOM TOOTH EXTRACTION      Current Outpatient Prescriptions  Medication Sig Dispense Refill  . fluticasone (FLONASE) 50 MCG/ACT nasal spray Place into both nostrils daily.    [name] FE 1/20 1-20 MG-MCG tablet TAKE 1 TABLET BY MOUTH DAILY. 84 tablet 0  . Multiple Vitamins-Minerals (MULTIVITAMIN PO) Take by mouth daily.     No current facility-administered medications for this visit.     Family History  Problem Relation Age of Onset  . Breast cancer Mother 79  . Diabetes Mother     type II, AODM  . Colon polyps Father   . Thyroid disease Father   . Hypertension Father   . Stroke Father     ROS:  Pertinent items  are noted in HPI.  Otherwise, a comprehensive ROS was negative.  Exam:   BP 112/68   Pulse 70   Resp 16   Ht 5' 3.25" (1.607 m)   Wt 119 lb (54 kg)   BMI 20.91 kg/m   Weight change: -1   Height: 5' 3.25" (160.7 cm)  Ht Readings from Last 3 Encounters:  02/27/16 5' 3.25" (1.607 m)  12/02/14 5' 3.25" (1.607 m)  11/29/13 5' 3.5" (1.613 m)    General appearance: alert, cooperative and appears stated age Head: Normocephalic, without obvious abnormality, atraumatic Neck: no adenopathy, supple, symmetrical, trachea midline and thyroid normal to inspection and palpation Lungs: clear to auscultation bilaterally Breasts: multiple bilateral breast cysts, no discrete masses, no LAD, no skin changes, no nipple discharge Heart: regular rate and rhythm Abdomen: soft, non-tender; bowel sounds normal; no masses,  no organomegaly Extremities: extremities normal, atraumatic, no cyanosis or edema Skin: Skin color, texture, turgor normal. No rashes or lesions Lymph nodes: Cervical, supraclavicular, and axillary nodes normal. No abnormal inguinal nodes palpated Neurologic: Grossly normal   Pelvic: External genitalia:  no lesions              Urethra:  normal appearing urethra with no masses, tenderness or lesions  Bartholins and Skenes: normal                 Vagina: normal appearing vagina with normal color and discharge, no lesions              Cervix: no lesions              Pap taken: Yes.   Bimanual Exam:  Uterus:  normal size, contour, position, consistency, mobility, non-tender              Adnexa: normal adnexa and no mass, fullness, tenderness               Rectovaginal: Confirms               Anus:  normal sphincter tone, no lesions  Chaperone was present for exam.  A:  Well Woman with normal exam H/o vit D def Recurrent breast cysts Failed endometrial ablation.  Bleeding is mcuh improved over the last few year  H/O BCC. Seeing derm every 6 months. Small uterine  fibroid  P: Mammogram yearly. D/W pt doing 3D MMG yearly. Pap and HR HPV today.  Last HR HPV 2013. Will try stopping pills this year and see how she feels CBC, CMP, Vit D, TSH, Lipids Hep C return annually or prn

## 2016-02-28 LAB — VITAMIN D 25 HYDROXY (VIT D DEFICIENCY, FRACTURES): Vit D, 25-Hydroxy: 32 ng/mL (ref 30–100)

## 2016-02-28 LAB — TSH: TSH: 3.95 mIU/L

## 2016-03-02 LAB — HEMOGLOBIN, FINGERSTICK: Hemoglobin, fingerstick: 14.2 g/dL (ref 12.0–16.0)

## 2016-03-03 LAB — IPS PAP TEST WITH HPV

## 2016-03-30 ENCOUNTER — Encounter: Payer: Self-pay | Admitting: Obstetrics & Gynecology

## 2016-04-01 ENCOUNTER — Telehealth: Payer: Self-pay

## 2016-04-01 NOTE — Telephone Encounter (Signed)
Telephone encounter created to discuss MyChart message with patient.

## 2016-04-01 NOTE — Telephone Encounter (Signed)
Pt is likely menopausal and needs FSH as well as discussion for options. Please make appt for her. MyChart message is below.    Thanks.    Edwinna Areola  ----- Message -----  From: Romero Belling  Sent: 03/30/2016  8:32 PM  To: [facility] Pool  Subject: Visit Follow-Up Question               Hi Dr. Sabra Heck,  After discontinuing birth control pills based on your recommendation during my visit on September 1st, I am having multiple, intense hot flashes on a daily basis (actually mostly p.m. hours). Could these hot flashes be a result of my hormone levels normalizing after being on the pill for so long, or do you believe the pills were masking the hot flashes? If these episodes continue, are there any non-hormonal therapies that can help decrease the hot flash symptoms?    Also, you mentioned something about using coconut or olive oil as a lubricant, but are there any specific products designed for vulvar/vaginal use that contain these ingredients? The specialist at [facility] who performed my endometrial ablation had recommended Aveeno Daily Moisturizer without sunscreen, so I have been using that product for a long time. However, I would like to use the healthiest alternative possible.    I look forward to hearing your response.    Thank you,  Christina Wallace   Left message to call [place] at [phone].

## 2016-04-01 NOTE — Telephone Encounter (Signed)
Patient returned Kaitlyn's call. °

## 2016-04-01 NOTE — Telephone Encounter (Signed)
Spoke with patient. Advised of message as seen below from [place]. Patient is agreeable and verbalizes understanding. Appointment scheduled for 04/05/2016 at 10 am with Dr.Miller. Patient is agreeable to date and time.  Routing to provider for final review. Patient agreeable to disposition. Will close encounter.

## 2016-04-05 ENCOUNTER — Encounter: Payer: Self-pay | Admitting: Obstetrics & Gynecology

## 2016-04-05 ENCOUNTER — Ambulatory Visit (INDEPENDENT_AMBULATORY_CARE_PROVIDER_SITE_OTHER): Payer: Commercial Managed Care - HMO | Admitting: Obstetrics & Gynecology

## 2016-04-05 VITALS — BP 100/70 | HR 84 | Resp 14 | Ht 63.25 in | Wt 117.0 lb

## 2016-04-05 DIAGNOSIS — N898 Other specified noninflammatory disorders of vagina: Secondary | ICD-10-CM

## 2016-04-05 DIAGNOSIS — L298 Other pruritus: Secondary | ICD-10-CM

## 2016-04-05 DIAGNOSIS — R232 Flushing: Secondary | ICD-10-CM | POA: Diagnosis not present

## 2016-04-05 MED ORDER — GABAPENTIN 100 MG PO CAPS: ORAL_CAPSULE | ORAL | 0 refills | Status: DC

## 2016-04-05 NOTE — Patient Instructions (Signed)
astroglide lubrication

## 2016-04-05 NOTE — Progress Notes (Signed)
GYNECOLOGY  VISIT   HPI: 55 y.o. [id number] Married Caucasian female with here for hot flashes.  She stopped her OCPs in September after her last AEX.  Having night sweats where she feels like she has to throw off the covers and then is sweaty.  This goes away very quickly.  She will have three or four of these in a row and then they stop.  She is having some hot flashes during the day but they are worse at night.    She is having some minor itching that seems to have occurred since she stopped the HRT.  Pt reports last episode of intercourse last week was painful at first.  Denies vaginal bleeding.     [facility] 2016 was 7.6.    GYNECOLOGIC HISTORY: No LMP recorded. Patient has had an ablation. Contraception: Vasectomy Menopausal hormone therapy: None  Patient Active Problem List   Diagnosis Date Noted  . DUB (dysfunctional uterine bleeding) 12/05/2013    Past Medical History:  Diagnosis Date  . BCC (basal cell carcinoma)    several more areas removed  . BCC (basal cell carcinoma), face    forehead, stomach, legs, shoulders, arm  . HSV-1 infection     Past Surgical History:  Procedure Laterality Date  . CESAREAN SECTION  1/91  . ENDOMETRIAL ABLATION  4/08  . MOHS SURGERY  4/13   BCC on forehead  . TONSILLECTOMY AND ADENOIDECTOMY    . WISDOM TOOTH EXTRACTION      MEDS:  Reviewed in EPIC and UTD  ALLERGIES: Codeine and Sulfa antibiotics  Family History  Problem Relation Age of Onset  . Breast cancer Mother 57  . Diabetes Mother     type II, AODM  . Colon polyps Father   . Thyroid disease Father   . Hypertension Father   . Stroke Father     SH:  Married, non smoker  Review of Systems  Constitutional:       Hot Flashes   Genitourinary:       Vulvar itching  All other systems reviewed and are negative.   PHYSICAL EXAMINATION:    BP 100/70 (BP Location: Right Arm, Patient Position: Sitting, Cuff Size: Normal)   Pulse 84   Resp 14   Ht 5' 3.25" (1.607 m)   Wt  117 lb (53.1 kg)   BMI 20.56 kg/m     General appearance: alert, cooperative and appears stated age Abdomen: soft, non-tender; bowel sounds normal; no masses,  no organomegaly  Pelvic: External genitalia:  no lesions              Urethra:  normal appearing urethra with no masses, tenderness or lesions              Bartholins and Skenes: normal                 Vagina: normal appearing vagina with normal color and discharge, no lesions              Cervix: no lesions              Bimanual Exam:  Uterus:  normal size, contour, position, consistency, mobility, non-tender              Adnexa: no mass, fullness, tenderness              Anus:  normal sphincter tone, no lesions  Chaperone was present for exam.  Assessment: Hot flashes Vaginal itching  Plan: [facility] obtained today  D/W pt options for hot flashes.  She really does not want to be on HRT.  Trial of gabapentin 100mg  nightly, titration up to 300mg  with incremental increases every 7 days.   Affirm pending.  Possibly toilet paper is causing her irritation.

## 2016-04-06 ENCOUNTER — Other Ambulatory Visit: Payer: Self-pay | Admitting: Obstetrics & Gynecology

## 2016-04-06 DIAGNOSIS — Z1231 Encounter for screening mammogram for malignant neoplasm of breast: Secondary | ICD-10-CM

## 2016-04-06 LAB — FOLLICLE STIMULATING HORMONE: FSH: 127.9 m[IU]/mL — ABNORMAL HIGH

## 2016-04-06 LAB — WET PREP BY MOLECULAR PROBE
Candida species: NEGATIVE
Gardnerella vaginalis: NEGATIVE
Trichomonas vaginosis: NEGATIVE

## 2016-05-03 ENCOUNTER — Encounter: Payer: Self-pay | Admitting: Obstetrics & Gynecology

## 2016-05-06 ENCOUNTER — Other Ambulatory Visit: Payer: Self-pay | Admitting: Obstetrics & Gynecology

## 2016-05-06 MED ORDER — GABAPENTIN 300 MG PO CAPS: 300.0000 mg | ORAL_CAPSULE | Freq: Every day | ORAL | 4 refills | Status: DC

## 2016-05-10 ENCOUNTER — Other Ambulatory Visit: Payer: Self-pay

## 2016-05-10 ENCOUNTER — Ambulatory Visit
Admission: RE | Admit: 2016-05-10 | Discharge: 2016-05-10 | Disposition: A | Discharge status: Home / Self Care | Payer: Commercial Managed Care - HMO | Source: Ambulatory Visit | Attending: Obstetrics and Gynecology | Admitting: Obstetrics and Gynecology

## 2016-05-10 ENCOUNTER — Telehealth: Payer: Self-pay | Admitting: Obstetrics & Gynecology

## 2016-05-10 ENCOUNTER — Ambulatory Visit (INDEPENDENT_AMBULATORY_CARE_PROVIDER_SITE_OTHER): Payer: Commercial Managed Care - HMO | Admitting: Obstetrics and Gynecology

## 2016-05-10 ENCOUNTER — Ambulatory Visit
Admission: RE | Admit: 2016-05-10 | Discharge: 2016-05-10 | Disposition: A | Discharge status: Home / Self Care | Payer: Commercial Managed Care - HMO | Source: Ambulatory Visit

## 2016-05-10 ENCOUNTER — Encounter: Payer: Self-pay | Admitting: Obstetrics and Gynecology

## 2016-05-10 VITALS — BP 92/60 | HR 84 | Resp 14 | Wt 120.0 lb

## 2016-05-10 DIAGNOSIS — N611 Abscess of the breast and nipple: Secondary | ICD-10-CM

## 2016-05-10 DIAGNOSIS — N63 Unspecified lump in unspecified breast: Secondary | ICD-10-CM | POA: Diagnosis not present

## 2016-05-10 MED ORDER — DICLOXACILLIN SODIUM 500 MG PO CAPS: 500.0000 mg | ORAL_CAPSULE | Freq: Four times a day (QID) | ORAL | 0 refills | Status: DC

## 2016-05-10 NOTE — Progress Notes (Addendum)
Scheduled patient while in office for left breast ultrasound and aspiration for today at 1pm with [facility]. Bilateral diagnostic mammogram orders placed if needed for future scheduling. The Breast Center is aware the patient is unable to have a mammogram of her left breast at this time due to abscess. Placed in mammogram hold. 2 day recheck scheduled for 05/12/2016 at 12:45 pm with Dr.Jertson.

## 2016-05-10 NOTE — Telephone Encounter (Signed)
Spoke with patient. Patient states that she was taken off OCP in September and noticed a small cyst in her left breast. Reports the area has grown in size over the last week and is now 3 finger width in size. Reports the area is painful and slightly red. Denies any warmth to the breast, fever, or chills. Advised I will speak with Dr.Jertson and return call with further recommendations. Patient is agreeable.  Return call to patient after reviewing with Dr.Jertson. Appointment scheduled for today 05/10/2016 at 9:30 am with Dr.Jertson. Patient is agreeable to date and time.  Routing to covering provider for final review. Patient agreeable to disposition. Will close encounter.

## 2016-05-10 NOTE — Telephone Encounter (Signed)
Patient has a left breast cyst that is painful and swelling.

## 2016-05-10 NOTE — Progress Notes (Signed)
GYNECOLOGY  VISIT   HPI: 55 y.o.   Married  Caucasian  female   [phone] with No LMP recorded. Patient has had an ablation.   here c/o left breast mass that is very painful. It has been slowly growing in the last month. Over the weekend it got very tender and large. Couldn't sleep well last night secondary to the pain. It is a little red, no fevers, no malaise.  She has a h/o breast cysts and has had to get them drained in the past.  She went off OCP's in 9/17, breast cysts were worse prior to being on the pill.   GYNECOLOGIC HISTORY: No LMP recorded. Patient has had an ablation. Contraception:postmenopause  Menopausal hormone therapy: none         OB History    Gravida Para Term Preterm AB Living   3 2     1 2    SAB TAB Ectopic Multiple Live Births                     Patient Active Problem List   Diagnosis Date Noted  . DUB (dysfunctional uterine bleeding) 12/05/2013    Past Medical History:  Diagnosis Date  . BCC (basal cell carcinoma)    several more areas removed  . BCC (basal cell carcinoma), face    forehead, stomach, legs, shoulders, arm  . HSV-1 infection     Past Surgical History:  Procedure Laterality Date  . CESAREAN SECTION  1/91  . ENDOMETRIAL ABLATION  4/08  . MOHS SURGERY  4/13   BCC on forehead  . TONSILLECTOMY AND ADENOIDECTOMY    . WISDOM TOOTH EXTRACTION      Current Outpatient Prescriptions  Medication Sig Dispense Refill  . fluticasone (FLONASE) 50 MCG/ACT nasal spray Place into both nostrils daily.    Marland Kitchen gabapentin (NEURONTIN) 300 MG capsule Take 1 capsule (300 mg total) by mouth at bedtime. 90 capsule 4  . Multiple Vitamins-Minerals (MULTIVITAMIN PO) Take by mouth daily.    . Vitamin D, Cholecalciferol, 400 units CAPS Take by mouth.     No current facility-administered medications for this visit.      ALLERGIES: Codeine and Sulfa antibiotics  Family History  Problem Relation Age of Onset  . Breast cancer Mother 59  . Diabetes Mother      type II, AODM  . Colon polyps Father   . Thyroid disease Father   . Hypertension Father   . Stroke Father     Social History   Social History  . Marital status: Married    Spouse name: N/A  . Number of children: N/A  . Years of education: N/A   Occupational History  . Not on file.   Social History Main Topics  . Smoking status: Never Smoker  . Smokeless tobacco: Never Used  . Alcohol use 3.0 oz/week    5 Glasses of wine per week  . Drug use: No  . Sexual activity: Yes    Partners: Male    Birth control/ protection: Pill, Surgical     Comment: husband vasectomy   Other Topics Concern  . Not on file   Social History Narrative  . No narrative on file    Review of Systems  Constitutional: Negative.   HENT: Negative.   Eyes: Negative.   Respiratory: Negative.   Cardiovascular: Negative.   Gastrointestinal: Negative.   Genitourinary:       Left breast mass/painful   Musculoskeletal: Negative.   Skin:  Negative.   Neurological: Negative.   Endo/Heme/Allergies: Negative.   Psychiatric/Behavioral: Negative.     PHYSICAL EXAMINATION:    BP 92/60 (BP Location: Right Arm, Patient Position: Sitting, Cuff Size: Normal)   Pulse 84   Resp 14   Wt 120 lb (54.4 kg)   BMI 21.09 kg/m     General appearance: alert, cooperative and appears stated age Breasts: in the upper outer quadrant of the left breast is a 7 cm, tender, firm mass with surrounding erythema. In the left breast at 10 o'clock is a 2 cm non tender mobile mass. In the right breast at 4-5 o'clock is a 2.5 cm smooth mobile, non tender lump.   ASSESSMENT Left breast abscess 2 other breast lumps, one in either breast that are most c/w her h/o breast cysts, needs diagnostic imaging.    PLAN Start antibiotics Will send to the breast center for imaging and drainage of the left breast abscess F/U in 2 days Call with fevers, worsening symptoms or any other c/o.   An After Visit Summary was printed and  given to the patient.

## 2016-05-10 NOTE — Addendum Note (Signed)
Addended by: Rolla Etienne E on: 05/10/2016 10:35 AM   Modules accepted: Orders

## 2016-05-12 ENCOUNTER — Encounter: Payer: Self-pay | Admitting: Obstetrics and Gynecology

## 2016-05-12 ENCOUNTER — Ambulatory Visit (INDEPENDENT_AMBULATORY_CARE_PROVIDER_SITE_OTHER): Payer: Commercial Managed Care - HMO | Admitting: Obstetrics and Gynecology

## 2016-05-12 VITALS — BP 98/60 | HR 72 | Resp 14 | Wt 122.0 lb

## 2016-05-12 DIAGNOSIS — N644 Mastodynia: Secondary | ICD-10-CM

## 2016-05-12 DIAGNOSIS — N6002 Solitary cyst of left breast: Secondary | ICD-10-CM | POA: Diagnosis not present

## 2016-05-12 DIAGNOSIS — N6001 Solitary cyst of right breast: Secondary | ICD-10-CM | POA: Diagnosis not present

## 2016-05-12 NOTE — Progress Notes (Signed)
GYNECOLOGY  VISIT   HPI: 55 y.o.   Married  Caucasian  female   [phone] with No LMP recorded. Patient has had an ablation.   here for follow up left breast abscess. She was seen 2 days ago, started on antibiotics and referred to radiology for drainage. She is feeling better, she is still tender from the drainage. The radiologist said it was a cyst not an abscess, but had surrounding inflammation  GYNECOLOGIC HISTORY: No LMP recorded. Patient has had an ablation. Contraception:ablation  Menopausal hormone therapy: none         OB History    Gravida Para Term Preterm AB Living   3 2     1 2    SAB TAB Ectopic Multiple Live Births                     Patient Active Problem List   Diagnosis Date Noted  . DUB (dysfunctional uterine bleeding) 12/05/2013    Past Medical History:  Diagnosis Date  . BCC (basal cell carcinoma)    several more areas removed  . BCC (basal cell carcinoma), face    forehead, stomach, legs, shoulders, arm  . HSV-1 infection     Past Surgical History:  Procedure Laterality Date  . CESAREAN SECTION  1/91  . ENDOMETRIAL ABLATION  4/08  . MOHS SURGERY  4/13   BCC on forehead  . TONSILLECTOMY AND ADENOIDECTOMY    . WISDOM TOOTH EXTRACTION      Current Outpatient Prescriptions  Medication Sig Dispense Refill  . dicloxacillin (DYNAPEN) 500 MG capsule Take 1 capsule (500 mg total) by mouth 4 (four) times daily. One tablet QID x 10D 40 capsule 0  . fluticasone (FLONASE) 50 MCG/ACT nasal spray Place into both nostrils daily.    Marland Kitchen gabapentin (NEURONTIN) 300 MG capsule Take 1 capsule (300 mg total) by mouth at bedtime. 90 capsule 4  . Multiple Vitamins-Minerals (MULTIVITAMIN PO) Take by mouth daily.    . Vitamin D, Cholecalciferol, 400 units CAPS Take by mouth.     No current facility-administered medications for this visit.      ALLERGIES: Codeine and Sulfa antibiotics  Family History  Problem Relation Age of Onset  . Breast cancer Mother 57  .  Diabetes Mother     type II, AODM  . Colon polyps Father   . Thyroid disease Father   . Hypertension Father   . Stroke Father     Social History   Social History  . Marital status: Married    Spouse name: N/A  . Number of children: N/A  . Years of education: N/A   Occupational History  . Not on file.   Social History Main Topics  . Smoking status: Never Smoker  . Smokeless tobacco: Never Used  . Alcohol use 3.0 oz/week    5 Glasses of wine per week  . Drug use: No  . Sexual activity: Yes    Partners: Male    Birth control/ protection: Pill, Surgical     Comment: husband vasectomy   Other Topics Concern  . Not on file   Social History Narrative  . No narrative on file    Review of Systems  Constitutional: Negative.   HENT: Negative.   Eyes: Negative.   Respiratory: Negative.   Cardiovascular: Negative.   Gastrointestinal: Negative.   Genitourinary:       Left breast tenderness  Musculoskeletal: Negative.   Skin: Negative.   Neurological: Negative.  Endo/Heme/Allergies: Negative.   Psychiatric/Behavioral: Negative.     PHYSICAL EXAMINATION:    BP 98/60 (BP Location: Right Arm, Patient Position: Sitting, Cuff Size: Normal)   Pulse 72   Resp 14   Wt 122 lb (55.3 kg)   BMI 21.44 kg/m     General appearance: alert, cooperative and appears stated age Breasts: In the right breast she has a 2.5 cm, smooth, mobile lump at 4-5 o'clock, she also has a several cm smooth mobile lump in the right breast at 10 o'clock. In the left breast she has 2 cm, smooth, non-tender mass at 10 o'clock. In the left breast she continues to have a tender lump at 2 o'clock, today it feels slightly smaller and less tender than at her prior exam (approximately 6 cm). This is the cyst that was drained 2 days ago. The skin erythema has resolved.   Chaperone was present for exam.  ASSESSMENT 2 days s/p drainage of a large left breast cyst with surrounding erythema and inflammation (not  an abscess). She has improved, still with a tender mass in that area, slightly smaller.     PLAN Continue the course of antibiotics F/U next week if not improved Otherwise f/u with Dr Sabra Heck in 6 weeks.  Other than breast cysts, her diagnostic breast imaging was normal F/U mammogram in 1 year   An After Visit Summary was printed and given to the patient.  15 minutes face to face time of which over 50% was spent in counseling.

## 2016-05-13 ENCOUNTER — Telehealth: Payer: Self-pay | Admitting: Obstetrics and Gynecology

## 2016-05-13 MED ORDER — CEPHALEXIN 500 MG PO CAPS: 500.0000 mg | ORAL_CAPSULE | Freq: Two times a day (BID) | ORAL | 0 refills | Status: DC

## 2016-05-13 NOTE — Telephone Encounter (Signed)
Spoke with patient. Advised as seen below per Dr. Talbert Nan. Advised per Dr. Talbert Nan, ok to take imodium if needed. Patient verbalizes understanding and is agreeable. New order placed for Keflex 500 mg po BID #10/0RF  Routing to provider for final review. Patient is agreeable to disposition. Will close encounter.

## 2016-05-13 NOTE — Telephone Encounter (Signed)
Patient said "I was seen twice this week and given an antibiotic and now I have diarrhea." Patient is asking if she should discontinue the antibiotic?Marland Kitchen

## 2016-05-13 NOTE — Telephone Encounter (Signed)
Please have her stop the dicloxacillin and since she has an allergy to sulfa, start her on Keflex 500 mg po BID, #10, no refills. This will complete 7 days of treatment. Since she didn't have an abscess that should be long enough

## 2016-05-13 NOTE — Telephone Encounter (Signed)
Spoke with patient. Patient states she was started on dicloxacillin 11/13 for breast abscess. Patient states she started having increased flatulence with a little liquid stool early afternoon yesterday. Patient reports this continued thru evening and into early morning. Patient reports initially taking pepto then imodium around 0200. Patient states not as much gas now, just liquid diarrhea- reports last stool around 0700. Patient states she did not take last dose of antibiotic last night or any this morning. Patient states she was concerned about mixing imodium and antibiotic. Patient reports staying hydrated, denies fever, chills or pain. Patient asking if she should continue antibiotic? Advised I will review with Dr. Talbert Nan and return call with recommendations. Patient is agreeable.    Dr. Talbert Nan, please advise?  Cc: Dr. Sabra Heck

## 2016-05-14 ENCOUNTER — Ambulatory Visit: Payer: Commercial Managed Care - HMO

## 2016-06-16 ENCOUNTER — Telehealth: Payer: Self-pay | Admitting: Obstetrics & Gynecology

## 2016-06-16 NOTE — Telephone Encounter (Signed)
Patient canceled her 6 week recheck appointment 06/25/16. Patient said she no longer needs this appointment.

## 2016-06-25 ENCOUNTER — Ambulatory Visit: Payer: Commercial Managed Care - HMO | Admitting: Obstetrics & Gynecology

## 2016-07-07 ENCOUNTER — Telehealth: Payer: Self-pay | Admitting: Obstetrics & Gynecology

## 2016-07-07 DIAGNOSIS — N6002 Solitary cyst of left breast: Secondary | ICD-10-CM

## 2016-07-07 NOTE — Telephone Encounter (Signed)
Patient has another breast cyst and is asking for a referral to the breast center.

## 2016-07-07 NOTE — Telephone Encounter (Signed)
Spoke with patient. Advised I have reviewed symptoms with Dr.Jertson who is agreeable with patient being seen at the [facility]. Spoke with the [facility] left diagnostic mammogram, left ultrasound, and left ultrasound with aspiration if needed scheduled for tomorrow 07/08/2016 at 11:20 am with 11 am arrival. Patient is agreeable and verbalizes understanding. Aware if symptoms worsen or develops new symptoms she will need to be seen for further evaluation with our office earlier. Patient is agreeable. Patient placed in mammogram hold.  Cc: Dr.Miller  Routing to covering provider for final review. Patient agreeable to disposition. Will close encounter.

## 2016-07-07 NOTE — Telephone Encounter (Signed)
Spoke with patient. Patient states that she has a cyst in her left breast that has increased in size and is very uncomfortable. Patient reports she has a history of breast cysts that have been drained. Patient was last seen in 04/2016 for left breast mass. Upon imaging at the [facility] this was determined to be a cyst that was drained. States this feels the exact same. Denies any fever, chills, redness or warmth to the breast. Patient is requesting orders be sent to the [facility] so she can be scheduled for evaluation and drainage of cyst if needed. "I get these often so I know it needs to be drained." Advised I will speak with covering provider and return call with further recommendations. Patient is agreeable.  Dr.Jertson, okay to help schedule this patient at the breast center for evaluation? If so will she need diagnostic mammogram with ultrasound and aspiration orders?

## 2016-07-08 ENCOUNTER — Ambulatory Visit
Admission: RE | Admit: 2016-07-08 | Discharge: 2016-07-08 | Disposition: A | Discharge status: Home / Self Care | Payer: Commercial Managed Care - HMO | Source: Ambulatory Visit | Attending: Obstetrics and Gynecology | Admitting: Obstetrics and Gynecology

## 2016-07-08 DIAGNOSIS — N6002 Solitary cyst of left breast: Secondary | ICD-10-CM

## 2017-05-14 ENCOUNTER — Other Ambulatory Visit: Payer: Self-pay | Admitting: Obstetrics & Gynecology

## 2017-05-16 NOTE — Telephone Encounter (Signed)
Medication refill request: gabapentin  Last AEX:  02-27-16  Next AEX: 06-09-17  Last MMG (if hormonal medication request): 07-08-16 abnormal- U/S left breast aspiration - cyst resolved completely - routine screening 11/18 Refill authorized: please advise

## 2017-05-18 ENCOUNTER — Other Ambulatory Visit: Payer: Self-pay | Admitting: Obstetrics & Gynecology

## 2017-05-18 DIAGNOSIS — Z139 Encounter for screening, unspecified: Secondary | ICD-10-CM

## 2017-06-09 ENCOUNTER — Ambulatory Visit: Payer: 59 | Admitting: Obstetrics & Gynecology

## 2017-06-09 ENCOUNTER — Other Ambulatory Visit: Payer: Self-pay

## 2017-06-09 ENCOUNTER — Encounter: Payer: Self-pay | Admitting: Obstetrics & Gynecology

## 2017-06-09 VITALS — BP 102/64 | HR 82 | Resp 14 | Ht 63.25 in | Wt 121.0 lb

## 2017-06-09 DIAGNOSIS — Z01419 Encounter for gynecological examination (general) (routine) without abnormal findings: Secondary | ICD-10-CM

## 2017-06-09 DIAGNOSIS — Z23 Encounter for immunization: Secondary | ICD-10-CM | POA: Diagnosis not present

## 2017-06-09 DIAGNOSIS — E559 Vitamin D deficiency, unspecified: Secondary | ICD-10-CM | POA: Diagnosis not present

## 2017-06-09 MED ORDER — GABAPENTIN 300 MG PO CAPS: 300.0000 mg | ORAL_CAPSULE | Freq: Every day | ORAL | 3 refills | Status: DC

## 2017-06-09 NOTE — Progress Notes (Signed)
56 y.o. [id number] MarriedCaucasianF here for annual exam.  Doing well.  Had some issues last year after stopping OCPs with breast cysts.  This has settle down now.    Has a new grandson, Lake Bells.  He is 12 weeks.    Having some vaginal and vulvar irritation at time.    No LMP recorded. Patient has had an ablation.          Sexually active: Yes.    The current method of family planning is post menopausal status.    Exercising: Yes.    aerobics, weights Smoker:  no  Health Maintenance: Pap:  02/27/16 neg. HR HPV:neg  History of abnormal Pap:  yes MMG:  07/08/16 Left breast BIRADS2:Benign. Has appt 07/15/17 Colonoscopy:  11/06/12 normal BMD:   Never TDaP:  2010 Pneumonia vaccine(s):  N/A Zostavax:   No Hep C testing: 02/27/16 Neg  Screening Labs: Here today    reports that  has never smoked. she has never used smokeless tobacco. She reports that she drinks about 3.0 oz of alcohol per week. She reports that she does not use drugs.  Past Medical History:  Diagnosis Date  . BCC (basal cell carcinoma)    several more areas removed  . BCC (basal cell carcinoma), face    forehead, stomach, legs, shoulders, arm  . HSV-1 infection     Past Surgical History:  Procedure Laterality Date  . CESAREAN SECTION  1/91  . ENDOMETRIAL ABLATION  4/08  . MOHS SURGERY  4/13   BCC on forehead  . TONSILLECTOMY AND ADENOIDECTOMY    . WISDOM TOOTH EXTRACTION      Current Outpatient Medications  Medication Sig Dispense Refill  . fluticasone (FLONASE) 50 MCG/ACT nasal spray Place into both nostrils daily.    Marland Kitchen gabapentin (NEURONTIN) 300 MG capsule TAKE 1 CAPSULE (300 MG TOTAL) BY MOUTH AT BEDTIME. 90 capsule 3  . Multiple Vitamins-Minerals (MULTIVITAMIN PO) Take by mouth daily.    . Vitamin D, Cholecalciferol, 400 units CAPS Take by mouth.     No current facility-administered medications for this visit.     Family History  Problem Relation Age of Onset  . Breast cancer Mother 65  . Diabetes Mother         type II, AODM  . Colon polyps Father   . Thyroid disease Father   . Hypertension Father   . Stroke Father     ROS:  Pertinent items are noted in HPI.  Otherwise, a comprehensive ROS was negative.  Exam:   BP 102/64 (BP Location: Right Arm, Patient Position: Sitting, Cuff Size: Normal)   Pulse 82   Resp 14   Ht 5' 3.25" (1.607 m)   Wt 121 lb (54.9 kg)   BMI 21.27 kg/m   Weight change: +2#   Height: 5' 3.25" (160.7 cm)  Ht Readings from Last 3 Encounters:  06/09/17 5' 3.25" (1.607 m)  04/05/16 5' 3.25" (1.607 m)  02/27/16 5' 3.25" (1.607 m)   General appearance: alert, cooperative and appears stated age Head: Normocephalic, without obvious abnormality, atraumatic Neck: no adenopathy, supple, symmetrical, trachea midline and thyroid normal to inspection and palpation Lungs: clear to auscultation bilaterally Breasts: normal appearance, no masses or tenderness Heart: regular rate and rhythm Abdomen: soft, non-tender; bowel sounds normal; no masses,  no organomegaly Extremities: extremities normal, atraumatic, no cyanosis or edema Skin: Skin color, texture, turgor normal. No rashes or lesions Lymph nodes: Cervical, supraclavicular, and axillary nodes normal. No abnormal inguinal nodes palpated  Neurologic: Grossly normal   Pelvic: External genitalia:  no lesions              Urethra:  normal appearing urethra with no masses, tenderness or lesions              Bartholins and Skenes: normal                 Vagina: normal appearing vagina with normal color and discharge, no lesions              Cervix: no lesions              Pap taken: No. Bimanual Exam:  Uterus:  normal size, contour, position, consistency, mobility, non-tender              Adnexa: normal adnexa and no mass, fullness, tenderness               Rectovaginal: Confirms               Anus:  normal sphincter tone, no lesions  Chaperone was present for exam.  A:  Well Woman with normal exam PMP, no HRT Hot  flashes Vit D deficiency Recurrent breast cysts H/o endometrial ablation H/O BCC.  Sees derm every every six months. H/O uterine fibroids  P:   Mammogram yearly.  Doing 3D due breast density pap smear and HR HPV neg 2017 Repeat Vit D today RF for Gabapentin 300mg  nightly.  #90/4RF. Update tdap today return annually or prn

## 2017-06-09 NOTE — Addendum Note (Signed)
Addended by: Megan Salon on: 06/09/2017 11:00 AM   Modules accepted: Orders

## 2017-06-10 LAB — VITAMIN D 25 HYDROXY (VIT D DEFICIENCY, FRACTURES): Vit D, 25-Hydroxy: 25.5 ng/mL — ABNORMAL LOW (ref 30.0–100.0)

## 2017-06-15 ENCOUNTER — Telehealth: Payer: Self-pay | Admitting: Obstetrics & Gynecology

## 2017-06-15 ENCOUNTER — Encounter: Payer: Self-pay | Admitting: Obstetrics & Gynecology

## 2017-06-15 NOTE — Telephone Encounter (Signed)
-----   Message from [place], Generic sent at 06/15/2017 1:47 PM EST -----    Hi Dr. Sabra Heck,  I received your recommendation to increase my Vit D intake to [phone] IU daily, but I am already using more than that amount. I currently take both a multivitamin with 1000 IU ([employer]zing Nutrition Women's One Daily) and a Vit D supplement with 400 IU (Source Naturals Vitamin D-3). Do you believe I should add an additional 400 IU tablet, or switch to a different type of multivitamin? I can't remember the exact reason I selected these particular supplements, but do remember I was trying to find a product that did not contain a lot of iron, as well as one that didn't contain a lot of phytoestrogens. Please let me know your thoughts.  Thanks,  Christina Wallace

## 2017-06-15 NOTE — Telephone Encounter (Signed)
Dr. Miller -please review and advise. 

## 2017-06-16 NOTE — Telephone Encounter (Signed)
Yes, I think she should take more.  2000 IU daily would be fine.  Thanks.

## 2017-06-17 NOTE — Telephone Encounter (Signed)
Spoke with patient, advised as seen below per Dr. Sabra Heck. Patient verbalizes understanding and is agreeable. Will close encounter.

## 2017-07-15 ENCOUNTER — Ambulatory Visit: Payer: Commercial Managed Care - HMO

## 2017-07-28 ENCOUNTER — Ambulatory Visit
Admission: RE | Admit: 2017-07-28 | Discharge: 2017-07-28 | Disposition: A | Discharge status: Home / Self Care | Payer: 59 | Source: Ambulatory Visit | Attending: Obstetrics & Gynecology | Admitting: Obstetrics & Gynecology

## 2017-07-28 DIAGNOSIS — Z1231 Encounter for screening mammogram for malignant neoplasm of breast: Secondary | ICD-10-CM | POA: Diagnosis not present

## 2017-07-28 DIAGNOSIS — Z139 Encounter for screening, unspecified: Secondary | ICD-10-CM

## 2017-09-14 ENCOUNTER — Ambulatory Visit: Payer: 59 | Admitting: Certified Nurse Midwife

## 2017-09-14 ENCOUNTER — Other Ambulatory Visit: Payer: Self-pay

## 2017-09-14 ENCOUNTER — Encounter: Payer: Self-pay | Admitting: Certified Nurse Midwife

## 2017-09-14 VITALS — BP 90/60 | HR 64 | Resp 16 | Ht 63.25 in | Wt 120.0 lb

## 2017-09-14 DIAGNOSIS — N898 Other specified noninflammatory disorders of vagina: Secondary | ICD-10-CM

## 2017-09-14 DIAGNOSIS — N951 Menopausal and female climacteric states: Secondary | ICD-10-CM

## 2017-09-14 NOTE — Progress Notes (Signed)
57 y.o. Married Caucasian female [phone] here with complaint of vaginal symptoms of slight itching,  and increase discharge. Describes discharge as excessive watery and  slightly odorous for the past month... Denies new personal products, uses Astroglide for vaginal dryness and working well at times. No new medications No STD concerns. Urinary symptoms none. Has stopped alcohol use and hot flashes have stopped, feeling much better. No other health issues today.  Review of Systems  Skin:       Vaginal itching, irritation & discharge    O:Healthy female WDWN Affect: normal, orientation x 3  Exam: Skin: warm and dry Abdomen:soft, non tender  Inguinal Lymph nodes: no enlargement or tenderness Pelvic exam: External genital: normal female slightly atrophic BUS: negative Vagina: scant white watery slightly odorous discharge noted. Shown to patient and this what she was seeing.  Affirm taken Cervix: normal, non tender, no CMT Uterus: normal, non tender Adnexa:normal, non tender, no masses or fullness noted   A:Normal pelvic exam Slight atrophic vaginitis R/O vaginal infection   P:Discussed findings of normal pelvic exam Discussed atrophic vaginitis and etiology. Handout given. Discussed Aveeno  sitz bath for comfort. Avoid moist clothes for extended period of time if possible. Coconut Oil use for skin protection and dryness to external skin discussed or OTC options. Will treat if indicates infection. Questions addressed. Lab: Affirm  Rv prn

## 2017-09-14 NOTE — Patient Instructions (Signed)
Atrophic Vaginitis Atrophic vaginitis is when the tissues that line the vagina become dry and thin. This is caused by a drop in estrogen. Estrogen helps:  To keep the vagina moist.  To make a clear fluid that helps: ? To lubricate the vagina for sex. ? To protect the vagina from infection.  If the lining of the vagina is dry and thin, it may:  Make sex painful. It may also cause bleeding.  Cause a feeling of: ? Burning. ? Irritation. ? Itchiness.  Make an exam of your vagina painful. It may also cause bleeding.  Make you lose interest in sex.  Cause a burning feeling when you pee.  Make your vaginal fluid (discharge) brown or yellow.  For some women, there are no symptoms. This condition is most common in women who do not get their regular menstrual periods anymore (menopause). This often starts when a woman is 45-55 years old. Follow these instructions at home:  Take medicines only as told by your doctor. Do not use any herbal or alternative medicines unless your doctor says it is okay.  Use over-the-counter products for dryness only as told by your doctor. These include: ? Creams. ? Lubricants. ? Moisturizers.  Do not douche.  Do not use products that can make your vagina dry. These include: ? Scented feminine sprays. ? Scented tampons. ? Scented soaps.  If it hurts to have sex, tell your sexual partner. Contact a doctor if:  Your discharge looks different than normal.  Your vagina has an unusual smell.  You have new symptoms.  Your symptoms do not get better with treatment.  Your symptoms get worse. This information is not intended to replace advice given to you by your health care provider. Make sure you discuss any questions you have with your health care provider. Document Released: 12/01/2007 Document Revised: 11/20/2015 Document Reviewed: 06/05/2014 Elsevier Interactive Patient Education  2018 Elsevier Inc.  

## 2017-09-15 LAB — VAGINITIS/VAGINOSIS, DNA PROBE
Candida Species: NEGATIVE
Gardnerella vaginalis: NEGATIVE
Trichomonas vaginosis: NEGATIVE

## 2017-10-26 DIAGNOSIS — D1801 Hemangioma of skin and subcutaneous tissue: Secondary | ICD-10-CM | POA: Diagnosis not present

## 2017-10-26 DIAGNOSIS — Z08 Encounter for follow-up examination after completed treatment for malignant neoplasm: Secondary | ICD-10-CM | POA: Diagnosis not present

## 2017-10-26 DIAGNOSIS — L570 Actinic keratosis: Secondary | ICD-10-CM | POA: Diagnosis not present

## 2017-10-26 DIAGNOSIS — L821 Other seborrheic keratosis: Secondary | ICD-10-CM | POA: Diagnosis not present

## 2018-04-06 DIAGNOSIS — Z23 Encounter for immunization: Secondary | ICD-10-CM | POA: Diagnosis not present

## 2018-05-01 DIAGNOSIS — C44612 Basal cell carcinoma of skin of right upper limb, including shoulder: Secondary | ICD-10-CM | POA: Diagnosis not present

## 2018-05-01 DIAGNOSIS — Z85828 Personal history of other malignant neoplasm of skin: Secondary | ICD-10-CM | POA: Diagnosis not present

## 2018-05-01 DIAGNOSIS — L570 Actinic keratosis: Secondary | ICD-10-CM | POA: Diagnosis not present

## 2018-05-01 DIAGNOSIS — Z08 Encounter for follow-up examination after completed treatment for malignant neoplasm: Secondary | ICD-10-CM | POA: Diagnosis not present

## 2018-06-12 DIAGNOSIS — H43813 Vitreous degeneration, bilateral: Secondary | ICD-10-CM | POA: Diagnosis not present

## 2018-06-14 ENCOUNTER — Other Ambulatory Visit: Payer: Self-pay | Admitting: Obstetrics & Gynecology

## 2018-06-14 DIAGNOSIS — Z1231 Encounter for screening mammogram for malignant neoplasm of breast: Secondary | ICD-10-CM

## 2018-06-19 ENCOUNTER — Other Ambulatory Visit: Payer: Self-pay | Admitting: Obstetrics & Gynecology

## 2018-06-19 NOTE — Telephone Encounter (Signed)
Medication refill request: Gabapentin Last AEX:  06/09/17 SM Next AEX: 08/18/18 Last MMG (if hormonal medication request): 07/28/17 BIRADS 1 negative/density c Refill authorized: 06/09/17 #90 w/3 refills; today please advise

## 2018-07-06 DIAGNOSIS — L570 Actinic keratosis: Secondary | ICD-10-CM | POA: Diagnosis not present

## 2018-07-31 ENCOUNTER — Ambulatory Visit
Admission: RE | Admit: 2018-07-31 | Discharge: 2018-07-31 | Disposition: A | Discharge status: Home / Self Care | Payer: 59 | Source: Ambulatory Visit | Attending: Obstetrics & Gynecology | Admitting: Obstetrics & Gynecology

## 2018-07-31 DIAGNOSIS — Z1231 Encounter for screening mammogram for malignant neoplasm of breast: Secondary | ICD-10-CM

## 2018-08-18 ENCOUNTER — Encounter

## 2018-08-18 ENCOUNTER — Ambulatory Visit: Payer: 59 | Admitting: Obstetrics & Gynecology

## 2018-08-18 ENCOUNTER — Other Ambulatory Visit (HOSPITAL_COMMUNITY)
Admission: RE | Admit: 2018-08-18 | Discharge: 2018-08-18 | Disposition: A | Discharge status: Home / Self Care | Payer: 59 | Source: Ambulatory Visit | Attending: Obstetrics & Gynecology | Admitting: Obstetrics & Gynecology

## 2018-08-18 ENCOUNTER — Other Ambulatory Visit: Payer: Self-pay

## 2018-08-18 ENCOUNTER — Encounter: Payer: Self-pay | Admitting: Obstetrics & Gynecology

## 2018-08-18 VITALS — BP 110/70 | HR 80 | Resp 16 | Ht 63.5 in | Wt 119.0 lb

## 2018-08-18 DIAGNOSIS — Z0000 Encounter for general adult medical examination without abnormal findings: Secondary | ICD-10-CM | POA: Diagnosis not present

## 2018-08-18 DIAGNOSIS — Z124 Encounter for screening for malignant neoplasm of cervix: Secondary | ICD-10-CM | POA: Insufficient documentation

## 2018-08-18 DIAGNOSIS — Z01419 Encounter for gynecological examination (general) (routine) without abnormal findings: Secondary | ICD-10-CM | POA: Diagnosis not present

## 2018-08-18 DIAGNOSIS — Z803 Family history of malignant neoplasm of breast: Secondary | ICD-10-CM

## 2018-08-18 DIAGNOSIS — Z1231 Encounter for screening mammogram for malignant neoplasm of breast: Secondary | ICD-10-CM

## 2018-08-18 DIAGNOSIS — R7989 Other specified abnormal findings of blood chemistry: Secondary | ICD-10-CM

## 2018-08-18 MED ORDER — GABAPENTIN 300 MG PO CAPS: ORAL_CAPSULE | ORAL | 4 refills | Status: DC

## 2018-08-18 NOTE — Progress Notes (Signed)
58 y.o. Christina Wallace Married White or Caucasian female here for annual exam.  Has 57 month old grandson.  She is going to have a granddaughter in a month.  They will be 18 months apart.    Denies vaginal bleeding.  Has some questions today about her mother's breast cancer.  She wants to know if she should have genetic testing.  She does not meet criteria but [employer] done today.  Risk is 19%.  Limited Breast MRI discussed.  Continues to have intermittent breast cysts.    No LMP recorded. Patient has had an ablation.          Sexually active: Yes.    The current method of family planning is post menopausal status.    Exercising: Yes.    cardio, aerobics, weights Smoker:  no  Health Maintenance: Pap:  02/27/16 Neg. HR HPV:neg   11/23/13 Neg  History of abnormal Pap:  yes  MMG:  07/31/18 BIRADS1:neg  Colonoscopy:  11/06/12 normal  BMD:   Never TDaP:  05/2017 Pneumonia vaccine(s):  n/a Shingrix:  Discussed with pt today.  Not interested at this time. Hep C testing: 02/27/16 Neg  Screening Labs: Here today - not fasting    reports that she has never smoked. She has never used smokeless tobacco. She reports that she does not drink alcohol or use drugs.  Past Medical History:  Diagnosis Date  . BCC (basal cell carcinoma)    several more areas removed  . BCC (basal cell carcinoma), face    forehead, stomach, legs, shoulders, arm  . HSV-1 infection     Past Surgical History:  Procedure Laterality Date  . CESAREAN SECTION  1/91  . ENDOMETRIAL ABLATION  4/08  . MOHS SURGERY  4/13   BCC on forehead  . TONSILLECTOMY AND ADENOIDECTOMY    . WISDOM TOOTH EXTRACTION      Current Outpatient Medications  Medication Sig Dispense Refill  . Cholecalciferol (VITAMIN D PO) Take 2,000 Int'l Units by mouth daily.     . fluticasone (FLONASE) 50 MCG/ACT nasal spray Place into both nostrils as needed.     . gabapentin (NEURONTIN) 300 MG capsule TAKE 1 CAPSULE BY MOUTH EVERYDAY AT BEDTIME 30  capsule 2  . niacinamide 500 MG tablet      No current facility-administered medications for this visit.     Family History  Problem Relation Age of Onset  . Breast cancer Mother 76  . Diabetes Mother        type II, AODM  . Colon polyps Father   . Thyroid disease Father   . Hypertension Father   . Stroke Father     Review of Systems  Endocrine: Positive for polydipsia.  Genitourinary: Positive for dyspareunia.  All other systems reviewed and are negative.   Exam:   BP 110/70 (BP Location: Right Arm, Patient Position: Sitting, Cuff Size: Normal)   Pulse 80   Resp 16   Ht 5' 3.5" (1.613 m)   Wt 119 lb (54 kg)   BMI 20.75 kg/m    Height: 5' 3.5" (161.3 cm)  Ht Readings from Last 3 Encounters:  08/18/18 5' 3.5" (1.613 m)  09/14/17 5' 3.25" (1.607 m)  06/09/17 5' 3.25" (1.607 m)    General appearance: alert, cooperative and appears stated age Head: Normocephalic, without obvious abnormality, atraumatic Neck: no adenopathy, supple, symmetrical, trachea midline and thyroid normal to inspection and palpation Lungs: clear to auscultation bilaterally Breasts: normal appearance, no masses or tenderness  Heart: regular rate and rhythm Abdomen: soft, non-tender; bowel sounds normal; no masses,  no organomegaly Extremities: extremities normal, atraumatic, no cyanosis or edema Skin: Skin color, texture, turgor normal. No rashes or lesions Lymph nodes: Cervical, supraclavicular, and axillary nodes normal. No abnormal inguinal nodes palpated Neurologic: Grossly normal  Pelvic: External genitalia:  no lesions              Urethra:  normal appearing urethra with no masses, tenderness or lesions              Bartholins and Skenes: normal                 Vagina: normal appearing vagina with normal color and discharge, no lesions              Cervix: no lesions              Pap taken: Yes.   Bimanual Exam:  Uterus:  normal size, contour, position, consistency, mobility, non-tender               Adnexa: normal adnexa and no mass, fullness, tenderness               Rectovaginal: Confirms               Anus:  normal sphincter tone, no lesions  Chaperone was present for exam.  A:  Well Woman with normal exam PMP, no HRT Vit D deficiency H/O breast cysts H/O endometrial ablation H/o BCC H/o uterine fibroids  P:   Mammogram guidelines reviewed.  Limited breast MRI discussed today.  She does want to proceed with this today. pap smear obtained today CBC, CMP, Lipids, TSH, Vit D D/w pt Shingrix vaccination today Gabapentin 300mg  nightly.  #90/4RF. return annually or prn

## 2018-08-18 NOTE — Addendum Note (Signed)
Addended by: Burnice Logan on: 08/18/2018 12:35 PM   Modules accepted: Orders

## 2018-08-18 NOTE — Progress Notes (Signed)
Spoke with patient while in office, order placed for abbreviated breast MRI at [facility] [facility]. Patient aware she will be contacted directly by [facility] to schedule. Contact information provided, questions answered. Patient verbalizes understanding and is agreeable.

## 2018-08-19 LAB — CBC
Hematocrit: 39.4 % (ref 34.0–46.6)
Hemoglobin: 13.2 g/dL (ref 11.1–15.9)
MCH: 30.3 pg (ref 26.6–33.0)
MCHC: 33.5 g/dL (ref 31.5–35.7)
MCV: 90 fL (ref 79–97)
Platelets: 221 10*3/uL (ref 150–450)
RBC: 4.36 x10E6/uL (ref 3.77–5.28)
RDW: 12.9 % (ref 11.7–15.4)
WBC: 4.9 10*3/uL (ref 3.4–10.8)

## 2018-08-19 LAB — COMPREHENSIVE METABOLIC PANEL
ALT: 16 IU/L (ref 0–32)
AST: 22 IU/L (ref 0–40)
Albumin/Globulin Ratio: 2.5 — ABNORMAL HIGH (ref 1.2–2.2)
Albumin: 5 g/dL — ABNORMAL HIGH (ref 3.8–4.9)
Alkaline Phosphatase: 85 IU/L (ref 39–117)
BUN/Creatinine Ratio: 27 — ABNORMAL HIGH (ref 9–23)
BUN: 16 mg/dL (ref 6–24)
Bilirubin Total: 0.4 mg/dL (ref 0.0–1.2)
CO2: 22 mmol/L (ref 20–29)
Calcium: 9.7 mg/dL (ref 8.7–10.2)
Chloride: 103 mmol/L (ref 96–106)
Creatinine, Ser: 0.6 mg/dL (ref 0.57–1.00)
GFR calc Af Amer: 118 mL/min/{1.73_m2} (ref >59)
GFR calc non Af Amer: 102 mL/min/{1.73_m2} (ref >59)
Globulin, Total: 2 g/dL (ref 1.5–4.5)
Glucose: 99 mg/dL (ref 65–99)
Potassium: 5 mmol/L (ref 3.5–5.2)
Sodium: 144 mmol/L (ref 134–144)
Total Protein: 7 g/dL (ref 6.0–8.5)

## 2018-08-19 LAB — LIPID PANEL
Chol/HDL Ratio: 2.6 ratio (ref 0.0–4.4)
Cholesterol, Total: 200 mg/dL — ABNORMAL HIGH (ref 100–199)
HDL: 77 mg/dL (ref >39)
LDL Calculated: 102 mg/dL — ABNORMAL HIGH (ref 0–99)
Triglycerides: 104 mg/dL (ref 0–149)
VLDL Cholesterol Cal: 21 mg/dL (ref 5–40)

## 2018-08-19 LAB — TSH: TSH: 7.18 u[IU]/mL — ABNORMAL HIGH (ref 0.450–4.500)

## 2018-08-19 LAB — VITAMIN D 25 HYDROXY (VIT D DEFICIENCY, FRACTURES): Vit D, 25-Hydroxy: 34.6 ng/mL (ref 30.0–100.0)

## 2018-08-21 LAB — CYTOLOGY - PAP: Diagnosis: NEGATIVE

## 2018-08-22 NOTE — Addendum Note (Signed)
Addended by: Megan Salon on: 08/22/2018 10:24 AM   Modules accepted: Orders

## 2018-08-23 ENCOUNTER — Other Ambulatory Visit (INDEPENDENT_AMBULATORY_CARE_PROVIDER_SITE_OTHER): Payer: 59

## 2018-08-23 DIAGNOSIS — R7989 Other specified abnormal findings of blood chemistry: Secondary | ICD-10-CM

## 2018-08-24 ENCOUNTER — Ambulatory Visit
Admission: RE | Admit: 2018-08-24 | Discharge: 2018-08-24 | Disposition: A | Discharge status: Home / Self Care | Payer: No Typology Code available for payment source | Source: Ambulatory Visit | Attending: Obstetrics & Gynecology | Admitting: Obstetrics & Gynecology

## 2018-08-24 DIAGNOSIS — Z803 Family history of malignant neoplasm of breast: Secondary | ICD-10-CM

## 2018-08-24 DIAGNOSIS — Z1231 Encounter for screening mammogram for malignant neoplasm of breast: Secondary | ICD-10-CM

## 2018-08-24 LAB — T3: T3, Total: 92 ng/dL (ref 71–180)

## 2018-08-24 LAB — T4, FREE: Free T4: 0.81 ng/dL — ABNORMAL LOW (ref 0.82–1.77)

## 2018-08-24 MED ORDER — GADOBUTROL 1 MMOL/ML IV SOLN
5.0000 mL | Freq: Once | INTRAVENOUS | Status: AC | PRN
  Administered 2018-08-24: 5 mL via INTRAVENOUS

## 2018-08-25 ENCOUNTER — Other Ambulatory Visit: Payer: Self-pay | Admitting: Obstetrics & Gynecology

## 2018-08-25 ENCOUNTER — Telehealth: Payer: Self-pay | Admitting: *Deleted

## 2018-08-25 DIAGNOSIS — N6002 Solitary cyst of left breast: Secondary | ICD-10-CM

## 2018-08-25 NOTE — Telephone Encounter (Signed)
Reviewed with Dr. Sabra Heck, call to patient. Advised as seen below. Order placed for left breast US at [facility]. Patient is aware she will be contacted by [facility] directly to schedule. Questions answered, patient verbalizes understanding and is agreeable.   Routing to provider for final review. Patient is agreeable to disposition. Will close encounter.

## 2018-08-25 NOTE — Telephone Encounter (Signed)
-----   Message from Volanda Napoleon sent at 08/25/2018  7:42 AM EST ----- Regarding: Breast MRI Recommendations Good Morning, The above patient had a breast MRI yesterday and below are the recommendations of Radiologist, Dr. Franki Cabot.  He would like this to be scheduled as soon as possible so that the suspected cyst doesn't go away before being seen on u/s.  RECOMMENDATION: 1. Second look targeted ultrasound of the LEFT breast, as detailed above. 2. If corresponding cyst can not be identified by ultrasound, recommend attempt at MRI guided biopsy for the ring-enhancing mass in the outer LEFT breast. If needed, MRI guided biopsy may not be technically possible due to its position within the upper-outer quadrant at far posterior depth.  Please notify the patient of these recommendations and enter the necessary orders in Epic.  Our schedulers will then contact the patient to schedule her appointment.  Please call me if you have any questions.  Thanks for your assistance! Volanda Napoleon [phone]

## 2018-08-31 ENCOUNTER — Ambulatory Visit
Admission: RE | Admit: 2018-08-31 | Discharge: 2018-08-31 | Disposition: A | Discharge status: Home / Self Care | Payer: 59 | Source: Ambulatory Visit | Attending: Obstetrics & Gynecology | Admitting: Obstetrics & Gynecology

## 2018-08-31 ENCOUNTER — Ambulatory Visit
Admission: RE | Admit: 2018-08-31 | Discharge: 2018-08-31 | Disposition: A | Discharge status: Home / Self Care | Payer: No Typology Code available for payment source | Source: Ambulatory Visit | Attending: Obstetrics & Gynecology | Admitting: Obstetrics & Gynecology

## 2018-08-31 ENCOUNTER — Other Ambulatory Visit: Payer: Self-pay | Admitting: Obstetrics & Gynecology

## 2018-08-31 DIAGNOSIS — N6002 Solitary cyst of left breast: Secondary | ICD-10-CM

## 2018-08-31 DIAGNOSIS — N6012 Diffuse cystic mastopathy of left breast: Secondary | ICD-10-CM | POA: Diagnosis not present

## 2018-09-04 ENCOUNTER — Other Ambulatory Visit: Payer: Self-pay | Admitting: *Deleted

## 2018-09-04 DIAGNOSIS — E039 Hypothyroidism, unspecified: Secondary | ICD-10-CM

## 2018-09-18 ENCOUNTER — Ambulatory Visit: Payer: 59 | Admitting: Internal Medicine

## 2018-09-18 ENCOUNTER — Other Ambulatory Visit: Payer: Self-pay

## 2018-09-18 ENCOUNTER — Encounter: Payer: Self-pay | Admitting: Internal Medicine

## 2018-09-18 VITALS — BP 120/60 | HR 82 | Temp 98.0°F | Ht 63.5 in | Wt 117.0 lb

## 2018-09-18 DIAGNOSIS — E039 Hypothyroidism, unspecified: Secondary | ICD-10-CM

## 2018-09-18 LAB — T3, FREE: T3, Free: 3.9 pg/mL (ref 2.3–4.2)

## 2018-09-18 LAB — TSH: TSH: 9.11 u[IU]/mL — ABNORMAL HIGH (ref 0.35–4.50)

## 2018-09-18 LAB — T4, FREE: Free T4: 0.64 ng/dL (ref 0.60–1.60)

## 2018-09-18 NOTE — Progress Notes (Signed)
Patient ID: Christina Wallace, female   DOB: October 05, 1960, 58 y.o.   MRN: [id number]    HPI  Christina Wallace is a 58 y.o.-year-old female, referred by her OB/GYN provider, Dr. Hale Bogus, for management of hypothyroidism.  Pt. has been found to have a slightly abnormal TSH in 10/2013.  However, the test normalized afterwards with level in the upper range of normal.  At last check with Dr. Sabra Heck in 07/2018, TSH is increased to 7.18.  A free T4 level was also low 2 days later. She was referred to endocrinology at that time.   I reviewed pt's thyroid tests: Lab Results  Component Value Date   TSH 7.180 (H) 08/18/2018   TSH 3.95 02/27/2016   TSH 3.881 12/02/2014   TSH 3.616 12/31/2013   TSH 4.536 (H) 11/23/2013   FREET4 0.81 (L) 08/23/2018   No results found for: T3FREE   Antithyroid antibodies: No results found for: THGAB No components found for: TPOAB  Pt denies: - weight gain - fatigue - cold intolerance, but does have hot flashes - depression - constipation - dry skin - hair loss  Pt denies feeling nodules in neck, hoarseness, + occasional dysphagia/no odynophagia, SOB with lying down.  She has a h/o eosonophilic esophagitis by EGD in ~2008.  She has + FH of thyroid disorders in: father (thyroid nodule), mother, sister, daughter - during pregnancy - now having a C-section soon. No FH of thyroid cancer.  No h/o radiation tx to head or neck. No recent use of iodine supplements.  Pt. also has a history of basal cell tumors  - on Niacinamide. She also takes 2000 units vitamin D daily. She exercises consistently: Cardio + weights  ROS: Constitutional: + See HPI, + poor sleep Eyes: no blurry vision, no xerophthalmia ENT: no sore throat, no nodules palpated in throat, no dysphagia/odynophagia, no hoarseness Cardiovascular: no CP/SOB/palpitations/leg swelling Respiratory: no cough/SOB Gastrointestinal: no N/V/D/C Musculoskeletal: no muscle/joint aches Skin: no rashes, + Easy  bruising Neurological: no tremors/numbness/tingling/dizziness Psychiatric: no depression/anxiety  Past Medical History:  Diagnosis Date  . BCC (basal cell carcinoma)    several more areas removed  . BCC (basal cell carcinoma), face    forehead, stomach, legs, shoulders, arm  . HSV-1 infection    Past Surgical History:  Procedure Laterality Date  . CESAREAN SECTION  1/91  . ENDOMETRIAL ABLATION  4/08  . MOHS SURGERY  4/13   BCC on forehead  . TONSILLECTOMY AND ADENOIDECTOMY    . WISDOM TOOTH EXTRACTION     Social History   Socioeconomic History  . Marital status: Married    Spouse name: Not on file  . Number of children: 2  . Years of education: Not on file  . Highest education level: Not on file  Occupational History  . Not on file  Social Needs  . Financial resource strain: Not on file  . Food insecurity:    Worry: Not on file    Inability: Not on file  . Transportation needs:    Medical: Not on file    Non-medical: Not on file  Tobacco Use  . Smoking status: Never Smoker  . Smokeless tobacco: Never Used  Substance and Sexual Activity  . Alcohol use: No    Frequency: Never  . Drug use: No   Current Outpatient Medications on File Prior to Visit  Medication Sig Dispense Refill  . imiquimod (ALDARA) 5 % cream     . Cholecalciferol (VITAMIN D PO) Take 2,000  Int'l Units by mouth daily.     . fluticasone (FLONASE) 50 MCG/ACT nasal spray Place into both nostrils as needed.     . gabapentin (NEURONTIN) 300 MG capsule TAKE 1 CAPSULE BY MOUTH EVERYDAY AT BEDTIME 90 capsule 4  . niacinamide 500 MG tablet      No current facility-administered medications on file prior to visit.    Allergies  Allergen Reactions  . Codeine Nausea And Vomiting  . Sulfa Antibiotics Rash   Family History  Problem Relation Age of Onset  . Breast cancer Mother 19  . Diabetes Mother        type II, AODM  . Colon polyps Father   . Thyroid disease Father   . Hypertension Father   .  Stroke Father   Also, sister with HTN. Please also see HPI.  PE: BP 120/60   Pulse 82   Temp 98 F (36.7 C)   Ht 5' 3.5" (1.613 m)   Wt 117 lb (53.1 kg)   SpO2 99%   BMI 20.40 kg/m  Wt Readings from Last 3 Encounters:  09/18/18 117 lb (53.1 kg)  08/18/18 119 lb (54 kg)  09/14/17 120 lb (54.4 kg)   Constitutional: Normal weight, in NAD Eyes: PERRLA, EOMI, no exophthalmos ENT: moist mucous membranes, no thyromegaly, no cervical lymphadenopathy Cardiovascular: RRR, No MRG Respiratory: CTA B Gastrointestinal: abdomen soft, NT, ND, BS+ Musculoskeletal: no deformities, strength intact in all 4 Skin: moist, warm, no rashes Neurological: no tremor with outstretched hands, DTR normal in all 4  ASSESSMENT: 1. Hypothyroidism  PLAN:  1. Patient with recent diagnosis of hypothyroidism, not on levothyroxine therapy yet.  She is reticent to start medication but she would need to continue the rest of her life and wanted to see endocrinology to see if treatment is really necessary at this point.  She is also interested in the etiology of her condition. - she appears euthyroid.  - she does not appear to have a goiter, thyroid nodules, or neck compression symptoms - At this visit, we discussed about different possible causes for her hypothyroidism to include Hashimoto's thyroiditis, history of subacute thyroiditis, iodine deficiency.  We discussed at length about Hashimoto's thyroiditis and I explained that this is the most common cause for hypothyroidism in [place].  This is an autoimmune disease that attacks her thyroid and meantime will lead to hypothyroidism dependency on thyroid hormones.   - I also explained that thyroid enlargement especially at the beginning of her Hashimoto thyroiditis course is not uncommon, and it has a waxing and waning character.  She has occasional problems swallowing but these may also be due to her history of eosinophilic esophagitis. - We discussed about treatment for  Hashimoto thyroiditis, which is actually limited to thyroid hormones in case her TFTs are abnormal. Supplements like selenium has been tried with various results, some showing improvement in the TPO antibodies. However, there are no randomized controlled trials of this are consistent results between trials. We also discussed about ways to improve her immune system (relaxation, diet, exercise, sleep) to reduce the Ab titer and, subsequently, the thyroid inflammation.  I did suggest that she cut out dairy since this is highly inflammatory for the gut and autoimmune inflammation may start in the gut - We discussed about correct intake of levothyroxine if we need to start, fasting, with water, separated by at least 30 minutes from breakfast, and separated by more than 4 hours from calcium, iron, multivitamins, acid reflux medications (PPIs). -  will check thyroid tests today: TSH, free T4, free T3, and will add  TPO and ATA antibodies to screen for Hashimoto's thyroiditis - If labs today are abnormal, she will need to return in ~6 weeks for repeat labs - Otherwise, I will see her back in 4 months  Component     Latest Ref Rng & Units 09/18/2018  TSH     0.35 - 4.50 uIU/mL 9.11 (H)  T4,Free(Direct)     0.60 - 1.60 ng/dL 0.64  Triiodothyronine,Free,Serum     2.3 - 4.2 pg/mL 3.9  Thyroperoxidase Ab SerPl-aCnc     <9 IU/mL 353 (H)  Thyroglobulin Ab     < or = 1 IU/mL <1  New diagnosis of Hashimoto's thyroiditis based on elevated TPO antibodies.  The free T4 and free T3 are normal, however, TSH is now higher, close to 10.  Because of the clear upward trend in her TSH, at this point, I would suggest to start a low-dose levothyroxine.  It would not be completely wrong to wait another 3 months, then repeat the tests and start levothyroxine then, so I will let the patient decide.  Philemon Kingdom, MD PhD [facility]

## 2018-09-18 NOTE — Patient Instructions (Addendum)
Please stop at the lab.  Please come back for a follow-up appointment in 4 months.   Hypothyroidism  Hypothyroidism is when the thyroid gland does not make enough of certain hormones (it is underactive). The thyroid gland is a small gland located in the lower front part of the neck, just in front of the windpipe (trachea). This gland makes hormones that help control how the body uses food for energy (metabolism) as well as how the heart and brain function. These hormones also play a role in keeping your bones strong. When the thyroid is underactive, it produces too little of the hormones thyroxine (T4) and triiodothyronine (T3). What are the causes? This condition may be caused by:  Hashimoto's disease. This is a disease in which the body's disease-fighting system (immune system) attacks the thyroid gland. This is the most common cause.  Viral infections.  Pregnancy.  Certain medicines.  Birth defects.  Past radiation treatments to the head or neck for cancer.  Past treatment with radioactive iodine.  Past exposure to radiation in the environment.  Past surgical removal of part or all of the thyroid.  Problems with a gland in the center of the brain (pituitary gland).  Lack of enough iodine in the diet. What increases the risk? You are more likely to develop this condition if:  You are female.  You have a family history of thyroid conditions.  You use a medicine called lithium.  You take medicines that affect the immune system (immunosuppressants). What are the signs or symptoms? Symptoms of this condition include:  Feeling as though you have no energy (lethargy).  Not being able to tolerate cold.  Weight gain that is not explained by a change in diet or exercise habits.  Lack of appetite.  Dry skin.  Coarse hair.  Menstrual irregularity.  Slowing of thought processes.  Constipation.  Sadness or depression. How is this diagnosed? This condition may be  diagnosed based on:  Your symptoms, your medical history, and a physical exam.  Blood tests. You may also have imaging tests, such as an ultrasound or MRI. How is this treated? This condition is treated with medicine that replaces the thyroid hormones that your body does not make. After you begin treatment, it may take several weeks for symptoms to go away. Follow these instructions at home:  Take over-the-counter and prescription medicines only as told by your health care provider.  If you start taking any new medicines, tell your health care provider.  Keep all follow-up visits as told by your health care provider. This is important. ? As your condition improves, your dosage of thyroid hormone medicine may change. ? You will need to have blood tests regularly so that your health care provider can monitor your condition. Contact a health care provider if:  Your symptoms do not get better with treatment.  You are taking thyroid replacement medicine and you: ? Sweat a lot. ? Have tremors. ? Feel anxious. ? Lose weight rapidly. ? Cannot tolerate heat. ? Have emotional swings. ? Have diarrhea. ? Feel weak. Get help right away if you have:  Chest pain.  An irregular heartbeat.  A rapid heartbeat.  Difficulty breathing. Summary  Hypothyroidism is when the thyroid gland does not make enough of certain hormones (it is underactive).  When the thyroid is underactive, it produces too little of the hormones thyroxine (T4) and triiodothyronine (T3).  The most common cause is Hashimoto's disease, a disease in which the body's disease-fighting system (immune  system) attacks the thyroid gland. The condition can also be caused by viral infections, medicine, pregnancy, or past radiation treatment to the head or neck.  Symptoms may include weight gain, dry skin, constipation, feeling as though you do not have energy, and not being able to tolerate cold.  This condition is treated with  medicine to replace the thyroid hormones that your body does not make. This information is not intended to replace advice given to you by your health care provider. Make sure you discuss any questions you have with your health care provider. Document Released: 06/14/2005 Document Revised: 05/25/2017 Document Reviewed: 05/25/2017 Elsevier Interactive Patient Education  2019 [employer].

## 2018-09-19 ENCOUNTER — Other Ambulatory Visit: Payer: Self-pay | Admitting: Internal Medicine

## 2018-09-19 ENCOUNTER — Encounter: Payer: Self-pay | Admitting: Internal Medicine

## 2018-09-19 DIAGNOSIS — E038 Other specified hypothyroidism: Secondary | ICD-10-CM

## 2018-09-19 DIAGNOSIS — E063 Autoimmune thyroiditis: Secondary | ICD-10-CM | POA: Insufficient documentation

## 2018-09-19 LAB — THYROID PEROXIDASE ANTIBODY: Thyroperoxidase Ab SerPl-aCnc: 353 IU/mL — ABNORMAL HIGH (ref <9)

## 2018-09-19 LAB — THYROGLOBULIN ANTIBODY: Thyroglobulin Ab: <1 IU/mL (ref <=1)

## 2018-09-20 ENCOUNTER — Encounter: Payer: Self-pay | Admitting: Internal Medicine

## 2018-09-28 ENCOUNTER — Encounter: Payer: Self-pay | Admitting: Internal Medicine

## 2018-11-01 DIAGNOSIS — L718 Other rosacea: Secondary | ICD-10-CM | POA: Diagnosis not present

## 2018-11-01 DIAGNOSIS — L570 Actinic keratosis: Secondary | ICD-10-CM | POA: Diagnosis not present

## 2018-11-01 DIAGNOSIS — Z08 Encounter for follow-up examination after completed treatment for malignant neoplasm: Secondary | ICD-10-CM | POA: Diagnosis not present

## 2018-11-01 DIAGNOSIS — Z85828 Personal history of other malignant neoplasm of skin: Secondary | ICD-10-CM | POA: Diagnosis not present

## 2018-12-18 ENCOUNTER — Other Ambulatory Visit (INDEPENDENT_AMBULATORY_CARE_PROVIDER_SITE_OTHER): Payer: 59

## 2018-12-18 ENCOUNTER — Other Ambulatory Visit: Payer: Self-pay

## 2018-12-18 DIAGNOSIS — E063 Autoimmune thyroiditis: Secondary | ICD-10-CM | POA: Diagnosis not present

## 2018-12-18 DIAGNOSIS — E038 Other specified hypothyroidism: Secondary | ICD-10-CM

## 2018-12-18 LAB — T4, FREE: Free T4: 0.77 ng/dL (ref 0.60–1.60)

## 2018-12-18 LAB — TSH: TSH: 9.68 u[IU]/mL — ABNORMAL HIGH (ref 0.35–4.50)

## 2018-12-18 LAB — T3, FREE: T3, Free: 3.1 pg/mL (ref 2.3–4.2)

## 2018-12-19 LAB — THYROID PEROXIDASE ANTIBODY: Thyroperoxidase Ab SerPl-aCnc: 293 IU/mL — ABNORMAL HIGH (ref <9)

## 2018-12-20 ENCOUNTER — Encounter: Payer: Self-pay | Admitting: Internal Medicine

## 2018-12-22 ENCOUNTER — Other Ambulatory Visit: Payer: Self-pay | Admitting: Internal Medicine

## 2018-12-22 ENCOUNTER — Encounter: Payer: Self-pay | Admitting: Internal Medicine

## 2018-12-22 MED ORDER — LEVOTHYROXINE SODIUM 25 MCG PO TABS: 25.0000 ug | ORAL_TABLET | Freq: Every day | ORAL | 3 refills | Status: DC

## 2018-12-28 ENCOUNTER — Telehealth: Payer: Self-pay | Admitting: Obstetrics & Gynecology

## 2018-12-28 ENCOUNTER — Encounter: Payer: Self-pay | Admitting: Obstetrics & Gynecology

## 2018-12-28 NOTE — Telephone Encounter (Signed)
Patient sent the following message through [place]. Routing to triage to assist patient with request.  Hi Dr. Sabra Heck,  [place] had previously recommended [facility] as a nutritionist, but I never followed through on making an appointment. At this time, I would like to make an appointment with her to receive guidance on recommended foods/nutrients for both my husband's obesity & pre-diabetes, as well as for my Hashimoto's disease. Will you please make a referral so that I can make an appointment with her? No rush, but let me know when I can contact her.    Thank you for your help,  Christina Wallace

## 2019-01-01 ENCOUNTER — Other Ambulatory Visit: Payer: Self-pay | Admitting: Obstetrics & Gynecology

## 2019-01-01 DIAGNOSIS — E063 Autoimmune thyroiditis: Secondary | ICD-10-CM

## 2019-01-01 NOTE — Telephone Encounter (Signed)
Referral placed.  Please let her know her husband may need to be seen as well, just FYI.

## 2019-01-01 NOTE — Progress Notes (Signed)
Referral to nutritionist placed.

## 2019-01-01 NOTE — Telephone Encounter (Signed)
Patient notified Dr.Miller placed a referral to Nutrition Management and they should call to schedule an appointment.

## 2019-01-01 NOTE — Telephone Encounter (Signed)
Routed to [place] for referral recommendation.

## 2019-01-04 ENCOUNTER — Ambulatory Visit: Payer: 59 | Admitting: Internal Medicine

## 2019-01-18 ENCOUNTER — Encounter: Payer: Self-pay | Admitting: Skilled Nursing Facility1

## 2019-01-18 ENCOUNTER — Encounter: Payer: 59 | Attending: Obstetrics & Gynecology | Admitting: Skilled Nursing Facility1

## 2019-01-18 ENCOUNTER — Other Ambulatory Visit: Payer: Self-pay

## 2019-01-18 DIAGNOSIS — E038 Other specified hypothyroidism: Secondary | ICD-10-CM | POA: Diagnosis present

## 2019-01-18 DIAGNOSIS — E063 Autoimmune thyroiditis: Secondary | ICD-10-CM | POA: Insufficient documentation

## 2019-01-18 NOTE — Progress Notes (Signed)
  Assessment:  Primary concerns today: hashimotos.   Pt states she has been weighing every day for years. Pt strongly wants to avoid weight gain. Pt states she keeps her grand kids. Pt states she is a grazer. Pt states she loves red meat. Pt states she loves milk. Pt states she enjoys desserts but does not over eat them. Pt had a lot of questions so Dietitian just went through answering and educating via these questions the main topic focusing on gut health, label reading for unhealthy ingredients, and a plant based diet. Pt also had a number of questions about prediabetes for her husband.   MEDICATIONS: see list   DIETARY INTAKE:  Usual eating pattern includes 3  meals and apple and clementine snacks per day.  Everyday foods include meats.  Avoided foods include none stated.    24-hr recall: corn chips eaten throughout the day B ( AM): oatmeal and milk and 2 squares of dark chocolate  Snk ( AM): peanut butter or nuts  L ( PM): yogurt  Snk ( PM): clementine  D ( PM): meatloaf and tomato  Snk ( PM): apple   Almond thin chips Beverages: milk, water Usual physical activity:   Estimated energy needs: 1500 calories 170 g carbohydrates 112 g protein 42 g fat  Progress Towards Goal(s):  In progress.    Intervention:  Nutrition counseling for healthy diet within the context of hashimoto's (antinfalmmatory/gut health).  Goals: Continue the variety of foods in your diet Try more plant based proteins throughout the week like veggie grillers Try rice and beans as your protein source  Aim for whole foods approach of selenium such as eggs and nuts   Teaching Method Utilized:  Visual Auditory Hands on  Handouts given during visit include:  benefits of exercise   Barriers to learning/adherence to lifestyle change: none identified   Demonstrated degree of understanding via:  Teach Back   Monitoring/Evaluation:  Dietary intake, exercise, and body weight prn.

## 2019-02-13 ENCOUNTER — Other Ambulatory Visit (INDEPENDENT_AMBULATORY_CARE_PROVIDER_SITE_OTHER): Payer: 59

## 2019-02-13 ENCOUNTER — Other Ambulatory Visit: Payer: Self-pay

## 2019-02-13 ENCOUNTER — Telehealth: Payer: Self-pay

## 2019-02-13 DIAGNOSIS — E063 Autoimmune thyroiditis: Secondary | ICD-10-CM

## 2019-02-13 DIAGNOSIS — E038 Other specified hypothyroidism: Secondary | ICD-10-CM | POA: Diagnosis not present

## 2019-02-13 LAB — T4, FREE: Free T4: 0.85 ng/dL (ref 0.60–1.60)

## 2019-02-13 LAB — T3, FREE: T3, Free: 3.1 pg/mL (ref 2.3–4.2)

## 2019-02-13 LAB — TSH: TSH: 3.33 u[IU]/mL (ref 0.35–4.50)

## 2019-02-13 NOTE — Telephone Encounter (Signed)
Labs ordered.

## 2019-02-16 ENCOUNTER — Other Ambulatory Visit: Payer: Self-pay

## 2019-02-16 ENCOUNTER — Ambulatory Visit: Payer: 59 | Admitting: Internal Medicine

## 2019-02-16 ENCOUNTER — Encounter: Payer: Self-pay | Admitting: Internal Medicine

## 2019-02-16 VITALS — BP 120/70 | HR 96 | Ht 63.5 in | Wt 119.0 lb

## 2019-02-16 DIAGNOSIS — E063 Autoimmune thyroiditis: Secondary | ICD-10-CM | POA: Diagnosis not present

## 2019-02-16 DIAGNOSIS — E038 Other specified hypothyroidism: Secondary | ICD-10-CM | POA: Diagnosis not present

## 2019-02-16 NOTE — Progress Notes (Signed)
Patient ID: Christina Wallace, female   DOB: Feb 11, 1961, 58 y.o.   MRN: [id number]    HPI  Christina Wallace is a 58 y.o.-year-old female, initially referred by her OB/GYN provider, Dr. Hale Bogus, now returning for follow-up for hypothyroidism due to Hashimoto's thyroiditis.  Last visit 5 months ago.  Pt. has been found to have a slightly abnormal TSH in 10/2013.  However, the test normalized afterwards with level in the upper range of normal.  At last check with Dr. Sabra Heck in 07/2018, TSH is increased to 7.18.  A free T4 level was also low 2 days later. She was referred to endocrinology at that time.   At last visit, in 08/2018, her TSH was close to 10 and I suggested to start levothyroxine.  However, she wanted to have the TFTs repeated and another TSH obtained 3 months later was still high.  At that time, she agreed to start levothyroxine, which she continues now at 25 mcg daily.  On this dose, TFTs normalized.  Pt is on levothyroxine 25 mcg daily, taken: - in am (we discussed that it is not optimal to take it at night due to variable absorption after dinner) - fasting - at least 30 min from b'fast - no Ca, Fe, PPIs - + MVI at lunch - not on Biotin  Reviewed her thyroid tests together: Lab Results  Component Value Date   TSH 3.33 02/13/2019   TSH 9.68 (H) 12/18/2018   TSH 9.11 (H) 09/18/2018   TSH 7.180 (H) 08/18/2018   TSH 3.95 02/27/2016   TSH 3.881 12/02/2014   TSH 3.616 12/31/2013   TSH 4.536 (H) 11/23/2013   FREET4 0.85 02/13/2019   FREET4 0.77 12/18/2018   FREET4 0.64 09/18/2018   FREET4 0.81 (L) 08/23/2018   T3FREE 3.1 02/13/2019   T3FREE 3.1 12/18/2018   T3FREE 3.9 09/18/2018   Lab Results  Component Value Date   T3FREE 3.1 02/13/2019   T3FREE 3.1 12/18/2018   T3FREE 3.9 09/18/2018    At last visit, antithyroid antibodies are positive giving her a diagnosis of Hashimoto's thyroiditis:  Component     Latest Ref Rng & Units 12/18/2018  Thyroperoxidase Ab SerPl-aCnc    <9 IU/mL 293 (H)   Component     Latest Ref Rng & Units 09/18/2018  Thyroperoxidase Ab SerPl-aCnc     <9 IU/mL 353 (H)  Thyroglobulin Ab     < or = 1 IU/mL <1   Pt denies: - feeling nodules in neck - hoarseness - dysphagia - choking - SOB with lying down  She has + FH of thyroid disorders in: father (thyroid nodule), mother, sister, daughter - during pregnancy. No FH of thyroid cancer. No h/o radiation tx to head or neck. No herbal supplements. No Biotin use. No recent steroids use.   Pt. also has a history of basal cell tumors  - on Niacinamide. He takes vitamin D 2000 units daily. She has a h/o eosonophilic esophagitis by EGD in ~2008. She continues to exercise consistently: Cardio and weights.  ROS: Constitutional: no weight gain/no weight loss, no fatigue, no subjective hyperthermia, no subjective hypothermia Eyes: no blurry vision, no xerophthalmia ENT: no sore throat, + see HPI Cardiovascular: no CP/no SOB/no palpitations/no leg swelling Respiratory: no cough/no SOB/no wheezing Gastrointestinal: no N/no V/no D/no C/no acid reflux Musculoskeletal: no muscle aches/no joint aches Skin: no rashes, no hair loss Neurological: no tremors/no numbness/no tingling/no dizziness  I reviewed pt's medications, allergies, PMH, social hx, family hx,  and changes were documented in the history of present illness. Otherwise, unchanged from my initial visit note.  Past Medical History:  Diagnosis Date  . BCC (basal cell carcinoma)    several more areas removed  . BCC (basal cell carcinoma), face    forehead, stomach, legs, shoulders, arm  . HSV-1 infection    Past Surgical History:  Procedure Laterality Date  . CESAREAN SECTION  1/91  . ENDOMETRIAL ABLATION  4/08  . MOHS SURGERY  4/13   BCC on forehead  . TONSILLECTOMY AND ADENOIDECTOMY    . WISDOM TOOTH EXTRACTION     Social History   Socioeconomic History  . Marital status: Married    Spouse name: Not on file  . Number  of children: 2  . Years of education: Not on file  . Highest education level: Not on file  Occupational History  . Not on file  Social Needs  . Financial resource strain: Not on file  . Food insecurity:    Worry: Not on file    Inability: Not on file  . Transportation needs:    Medical: Not on file    Non-medical: Not on file  Tobacco Use  . Smoking status: Never Smoker  . Smokeless tobacco: Never Used  Substance and Sexual Activity  . Alcohol use: No    Frequency: Never  . Drug use: No   Current Outpatient Medications on File Prior to Visit  Medication Sig Dispense Refill  . Cholecalciferol (VITAMIN D PO) Take 2,000 Int'l Units by mouth daily.     . fluticasone (FLONASE) 50 MCG/ACT nasal spray Place into both nostrils as needed.     . gabapentin (NEURONTIN) 300 MG capsule TAKE 1 CAPSULE BY MOUTH EVERYDAY AT BEDTIME 90 capsule 4  . imiquimod (ALDARA) 5 % cream     . levothyroxine (SYNTHROID) 25 MCG tablet Take 1 tablet (25 mcg total) by mouth daily before breakfast. 45 tablet 3  . niacinamide 500 MG tablet      No current facility-administered medications on file prior to visit.    Allergies  Allergen Reactions  . Codeine Nausea And Vomiting  . Sulfa Antibiotics Rash   Family History  Problem Relation Age of Onset  . Breast cancer Mother 88  . Diabetes Mother        type II, AODM  . Colon polyps Father   . Thyroid disease Father   . Hypertension Father   . Stroke Father   Also, sister with HTN. Please also see HPI.  PE: There were no vitals taken for this visit. Wt Readings from Last 3 Encounters:  09/18/18 117 lb (53.1 kg)  08/18/18 119 lb (54 kg)  09/14/17 120 lb (54.4 kg)   Constitutional: Normal weight, in NAD Eyes: PERRLA, EOMI, no exophthalmos ENT: moist mucous membranes, no thyromegaly, no cervical lymphadenopathy Cardiovascular: RRR, No MRG Respiratory: CTA B Gastrointestinal: abdomen soft, NT, ND, BS+ Musculoskeletal: no deformities, strength  intact in all 4 Skin: moist, warm, no rashes Neurological: no tremor with outstretched hands, DTR normal in all 4  ASSESSMENT: 1. Hypothyroidism   2. Hashimoto's thyroiditis  PLAN:  1. Patient with relatively recent diagnosis of hypothyroidism, not on levothyroxine at last visit, but which new diagnosis of Hashimoto's thyroiditis and worsening of her TFTs after which we had to start levothyroxine.  We started at the lowest dose, 25 mcg daily, which she is tolerating well.  On this dose, her TFTs normalized per last check 3 days ago.   -  pt feels good on this dose of levothyroxine (but she was asymptomatic before, also), and has no complaints. - we discussed about taking the thyroid hormone every day, with water, >30 minutes before breakfast, separated by >4 hours from acid reflux medications, calcium, iron, multivitamins. Pt. is taking it correctly. - will check thyroid tests in ~2 months to make sure they stay stable: TSH and fT4.  - If labs are abnormal, she will need to return for repeat TFTs in 1.5 months  2.  Hashimoto's thyroiditis -Diagnosed since last visit - we had a long discussion about her Hashimoto thyroiditis diagnosis. I explained that this is an autoimmune disorder, in which she develops antibodies against her own thyroid. The antibodies bind to the thyroid tissue and cause inflammation, and, eventually, destruction of the gland and hypothyroidism. We don't know how long this process can be, it can last from months to years.  As of now, she only requires very little supplementation with levothyroxine. - I also explained that thyroid enlargement especially at the beginning of her Hashimoto thyroiditis course is not uncommon, and it has a waxing and waning character.  I do not feel goiter on palpation and she does not have neck compression symptoms. - We discussed about treatment for Hashimoto thyroiditis, which is actually limited to thyroid hormones in case her TFTs are abnormal.  Supplements like selenium has been tried with various results, some showing improvement in the TPO antibodies. However, there are no randomized controlled trials of this are consistent results between trials.  She did try selenium and did not feel the difference so at this visit we discussed that she can stop. -We also discussed about ways to improve her immune system (relaxation, diet, exercise, sleep) to reduce the Ab titer and, subsequently, the thyroid inflammation.  Philemon Kingdom, MD PhD [facility]

## 2019-02-16 NOTE — Patient Instructions (Addendum)
Please continue Levothyroxine 25 mcg daily.  Take the thyroid hormone every day, with water, at least 30 minutes before breakfast, separated by at least 4 hours from: - acid reflux medications - calcium - iron - multivitamins  Please come back for a follow-up appointment in 6 months and labs in ~2 months.

## 2019-02-22 ENCOUNTER — Ambulatory Visit: Payer: 59 | Admitting: Podiatry

## 2019-02-22 ENCOUNTER — Other Ambulatory Visit: Payer: Self-pay

## 2019-02-22 ENCOUNTER — Encounter: Payer: Self-pay | Admitting: Podiatry

## 2019-02-22 DIAGNOSIS — M2041 Other hammer toe(s) (acquired), right foot: Secondary | ICD-10-CM

## 2019-02-22 DIAGNOSIS — L600 Ingrowing nail: Secondary | ICD-10-CM

## 2019-02-22 DIAGNOSIS — M2042 Other hammer toe(s) (acquired), left foot: Secondary | ICD-10-CM

## 2019-02-22 DIAGNOSIS — B351 Tinea unguium: Secondary | ICD-10-CM

## 2019-02-23 NOTE — Progress Notes (Signed)
Subjective:   Patient ID: Christina Wallace, female   DOB: 58 y.o.   MRN: [id number]   HPI Patient concerned about discoloration of nailbeds with history of ingrown toenails which occur at different times and also mild digital deformities the fifth toes with little lesions on the outside of the toes.  Patient states they do get occasionally tender but it is more at this point discoloration and concern and patient does not smoke likes to be active   Review of Systems  All other systems reviewed and are negative.       Objective:  Physical Exam Vitals signs and nursing note reviewed.  Constitutional:      Appearance: She is well-developed.  Pulmonary:     Effort: Pulmonary effort is normal.  Musculoskeletal: Normal range of motion.  Skin:    General: Skin is warm.  Neurological:     Mental Status: She is alert.     Neurovascular status found to be intact muscle strength found to be adequate range of motion within normal limits.  Patient is noted to have incurvated nailbeds 1-5 of both feet with yellow discoloration of the second nails and moderate rotational deformity of the fifth digits bilateral with distal lateral keratotic lesion formation.  Patient has good digital perfusion     Assessment:  Mycotic nail infection of a mild nature with probable trauma as inciting factor along with hammertoe deformity with lesion formation     Plan:  H&P education concerning different conditions discussed with patient along with treatment options.  With an absence of pain and most of the nail discoloration secondary to trauma I do not recommend aggressive treatment but I have advised this patient on topical medicines and the consideration for surgery of the nailbeds if pain or deformity or infection were to occur

## 2019-04-18 ENCOUNTER — Other Ambulatory Visit (INDEPENDENT_AMBULATORY_CARE_PROVIDER_SITE_OTHER): Payer: 59

## 2019-04-18 ENCOUNTER — Other Ambulatory Visit: Payer: Self-pay

## 2019-04-18 DIAGNOSIS — E038 Other specified hypothyroidism: Secondary | ICD-10-CM | POA: Diagnosis not present

## 2019-04-18 DIAGNOSIS — E063 Autoimmune thyroiditis: Secondary | ICD-10-CM

## 2019-04-18 LAB — T4, FREE: Free T4: 0.68 ng/dL (ref 0.60–1.60)

## 2019-04-18 LAB — TSH: TSH: 4.18 u[IU]/mL (ref 0.35–4.50)

## 2019-06-14 ENCOUNTER — Other Ambulatory Visit: Payer: Self-pay | Admitting: Internal Medicine

## 2019-08-07 ENCOUNTER — Other Ambulatory Visit: Payer: Self-pay | Admitting: Obstetrics & Gynecology

## 2019-08-07 DIAGNOSIS — Z1231 Encounter for screening mammogram for malignant neoplasm of breast: Secondary | ICD-10-CM

## 2019-08-23 ENCOUNTER — Other Ambulatory Visit: Payer: Self-pay

## 2019-08-24 ENCOUNTER — Ambulatory Visit: Payer: 59 | Admitting: Internal Medicine

## 2019-08-24 ENCOUNTER — Encounter: Payer: Self-pay | Admitting: Internal Medicine

## 2019-08-24 VITALS — BP 96/60 | HR 84 | Ht 63.5 in | Wt 120.0 lb

## 2019-08-24 DIAGNOSIS — E038 Other specified hypothyroidism: Secondary | ICD-10-CM | POA: Diagnosis not present

## 2019-08-24 DIAGNOSIS — E063 Autoimmune thyroiditis: Secondary | ICD-10-CM | POA: Diagnosis not present

## 2019-08-24 LAB — T4, FREE: Free T4: 0.76 ng/dL (ref 0.60–1.60)

## 2019-08-24 LAB — TSH: TSH: 3.15 u[IU]/mL (ref 0.35–4.50)

## 2019-08-24 NOTE — Progress Notes (Signed)
Patient ID: Christina Wallace, female   DOB: 03/07/1961, 59 y.o.   MRN: [id number]   This visit occurred during the SARS-CoV-2 public health emergency.  Safety protocols were in place, including screening questions prior to the visit, additional usage of staff PPE, and extensive cleaning of exam room while observing appropriate contact time as indicated for disinfecting solutions.   HPI  Christina Wallace is a 59 y.o.-year-old female, initially referred by her OB/GYN provider, Dr. Hale Bogus, now returning for follow-up for hypothyroidism due to Hashimoto's thyroiditis.  Last visit 6 months ago.  Reviewed and addended history: Pt. has been found to have a slightly abnormal TSH in 10/2013.  However, the test normalized afterwards with level in the upper range of normal.  At last check with Dr. Sabra Heck in 07/2018, TSH is increased to 7.18.  A free T4 level was also low 2 days later. She was referred to endocrinology at that time.   In  08/2018, her TSH was close to 10 and I suggested to start levothyroxine.  However, she wanted to have the TFTs repeated and another TSH obtained 3 months later was still high.   In 11/2018 we checked her antithyroid antibodies and they were positive, giving her a diagnosis of Hashimoto's thyroiditis.  She tried selenium supplement, gluten-free diet in an effort to avoid starting levothyroxine, however, with no changes in TFTs.   In 11/2018, she agreed to start levothyroxine, which she continues now at 25 mcg daily.  On this dose, TFTs normalized.  Pt is on levothyroxine 25 mcg daily, taken: - in am (6:30- 7am) - fasting - at least 1h from b'fast (mostly 2h) - no Ca, Fe, PPIs, + multivitamins at lunch (very low iron, no calcium, biotin 150 mcg) - not on Biotin  Review latest thyroid tests: Lab Results  Component Value Date   TSH 4.18 04/18/2019   TSH 3.33 02/13/2019   TSH 9.68 (H) 12/18/2018   TSH 9.11 (H) 09/18/2018   TSH 7.180 (H) 08/18/2018   TSH 3.95  02/27/2016   TSH 3.881 12/02/2014   TSH 3.616 12/31/2013   TSH 4.536 (H) 11/23/2013   FREET4 0.68 04/18/2019   FREET4 0.85 02/13/2019   FREET4 0.77 12/18/2018   FREET4 0.64 09/18/2018   FREET4 0.81 (L) 08/23/2018   T3FREE 3.1 02/13/2019   T3FREE 3.1 12/18/2018   T3FREE 3.9 09/18/2018    Reviewed her previous thyroid antibody levels: Component     Latest Ref Rng & Units 12/18/2018  Thyroperoxidase Ab SerPl-aCnc     <9 IU/mL 293 (H)   Component     Latest Ref Rng & Units 09/18/2018  Thyroperoxidase Ab SerPl-aCnc     <9 IU/mL 353 (H)  Thyroglobulin Ab     < or = 1 IU/mL <1   Pt denies: - feeling nodules in neck - hoarseness - dysphagia - choking - SOB with lying down  She has + FH of thyroid disorders in: father (thyroid nodule), mother, sister, daughter - during pregnancy.  No family history of thyroid cancer.  No personal history of radiation treatment to head or neck. No herbal supplements, biotin, or steroid use.   Pt. also has a history of basal cell tumors  - on Niacinamide. She takes vitamin D 2000 units daily. She has a h/o eosonophilic esophagitis by EGD in ~2008. She continues to exercise consistently: Cardio and weights.  ROS: Constitutional: no weight gain/no weight loss, no fatigue, no subjective hyperthermia, no subjective hypothermia Eyes: no blurry  vision, no xerophthalmia ENT: no sore throat, + see HPI Cardiovascular: no CP/no SOB/no palpitations/no leg swelling Respiratory: no cough/no SOB/no wheezing Gastrointestinal: no N/no V/no D/no C/no acid reflux Musculoskeletal: no muscle aches/no joint aches Skin: no rashes, no hair loss Neurological: no tremors/no numbness/no tingling/no dizziness  I reviewed pt's medications, allergies, PMH, social hx, family hx, and changes were documented in the history of present illness. Otherwise, unchanged from my initial visit note.  Past Medical History:  Diagnosis Date  . BCC (basal cell carcinoma)    several  more areas removed  . BCC (basal cell carcinoma), face    forehead, stomach, legs, shoulders, arm  . HSV-1 infection    Past Surgical History:  Procedure Laterality Date  . CESAREAN SECTION  1/91  . ENDOMETRIAL ABLATION  4/08  . MOHS SURGERY  4/13   BCC on forehead  . TONSILLECTOMY AND ADENOIDECTOMY    . WISDOM TOOTH EXTRACTION     Social History   Socioeconomic History  . Marital status: Married    Spouse name: Not on file  . Number of children: 2  . Years of education: Not on file  . Highest education level: Not on file  Occupational History  . Not on file  Social Needs  . Financial resource strain: Not on file  . Food insecurity:    Worry: Not on file    Inability: Not on file  . Transportation needs:    Medical: Not on file    Non-medical: Not on file  Tobacco Use  . Smoking status: Never Smoker  . Smokeless tobacco: Never Used  Substance and Sexual Activity  . Alcohol use: No    Frequency: Never  . Drug use: No   Current Outpatient Medications on File Prior to Visit  Medication Sig Dispense Refill  . Cholecalciferol (VITAMIN D PO) Take 2,000 Int'l Units by mouth daily.     . fluticasone (FLONASE) 50 MCG/ACT nasal spray Place into both nostrils as needed.     . gabapentin (NEURONTIN) 300 MG capsule TAKE 1 CAPSULE BY MOUTH EVERYDAY AT BEDTIME 90 capsule 4  . levothyroxine (SYNTHROID) 25 MCG tablet TAKE 1 TABLET (25 MCG TOTAL) BY MOUTH DAILY BEFORE BREAKFAST. 30 tablet 5  . niacinamide 500 MG tablet      No current facility-administered medications on file prior to visit.   Allergies  Allergen Reactions  . Codeine Nausea And Vomiting  . Sulfa Antibiotics Rash   Family History  Problem Relation Age of Onset  . Breast cancer Mother 63  . Diabetes Mother        type II, AODM  . Colon polyps Father   . Thyroid disease Father   . Hypertension Father   . Stroke Father   Also, sister with HTN. Please also see HPI.  PE: BP 96/60   Pulse 84   Ht 5' 3.5"  (1.613 m)   Wt 120 lb (54.4 kg)   SpO2 96%   BMI 20.92 kg/m  Wt Readings from Last 3 Encounters:  08/24/19 120 lb (54.4 kg)  02/16/19 119 lb (54 kg)  09/18/18 117 lb (53.1 kg)   Constitutional: normal weight, in NAD Eyes: PERRLA, EOMI, no exophthalmos ENT: moist mucous membranes, no thyromegaly, no cervical lymphadenopathy Cardiovascular: RRR, No MRG Respiratory: CTA B Gastrointestinal: abdomen soft, NT, ND, BS+ Musculoskeletal: no deformities, strength intact in all 4 Skin: moist, warm, no rashes Neurological: no tremor with outstretched hands, DTR normal in all 4  ASSESSMENT: 1. Hypothyroidism  2/2 Hashimoto's thyroiditis  PLAN:  1. Patient with history of hypothyroidism, diagnosed as due to Hashimoto's thyroiditis in 11/2018 when her thyroid antibodies returned elevated.  She is currently on low-dose levothyroxine, 25 mcg daily. -We discussed that the usual evolution of Hashimoto's hypothyroidism is towards complete dependence only.  She is hoping that this will take along time for her. - latest thyroid labs reviewed with pt >> normal but close to the upper limit of normal: Lab Results  Component Value Date   TSH 4.18 04/18/2019   - she continues on LT4 25 mcg daily - pt feels good on this dose, however, she did not have any hypothyroid symptoms even before starting medication. - we discussed about taking the thyroid hormone every day, with water, >30 minutes before breakfast, separated by >4 hours from acid reflux medications, calcium, iron, multivitamins. Pt. is taking it correctly.  She does take multivitamins only 2 hours after levothyroxine, but they contain a very small amount of iron and no calcium. - will check thyroid tests today: TSH and fT4 and may need to change the dose of levothyroxine based on the results. - If labs are abnormal, she will need to return for repeat TFTs in 1.5 months  - Of note, she did try selenium in the past but did not feel a difference so we  stopped it. - I will see her back in a year, but with labs in 6 months  Office Visit on 08/24/2019  Component Date Value Ref Range Status  . TSH 08/24/2019 3.15  0.35 - 4.50 uIU/mL Final  . Free T4 08/24/2019 0.76  0.60 - 1.60 ng/dL Final   Comment: Specimens from patients who are undergoing biotin therapy and /or ingesting biotin supplements may contain high levels of biotin.  The higher biotin concentration in these specimens interferes with this Free T4 assay.  Specimens that contain high levels  of biotin may cause false high results for this Free T4 assay.  Please interpret results in light of the total clinical presentation of the patient.     Labs are normal.  We will continue the low-dose levothyroxine.  Philemon Kingdom, MD PhD [facility]

## 2019-08-24 NOTE — Patient Instructions (Signed)
Please continue Levothyroxine 25 mcg daily.  Take the thyroid hormone every day, with water, at least 30 minutes before breakfast, separated by at least 4 hours from: - acid reflux medications - calcium - iron - multivitamins  Please stop at the lab.  Please come back for a follow-up appointment in 1 year and labs in 6 months.

## 2019-09-06 ENCOUNTER — Other Ambulatory Visit: Payer: Self-pay

## 2019-09-06 ENCOUNTER — Ambulatory Visit
Admission: RE | Admit: 2019-09-06 | Discharge: 2019-09-06 | Disposition: A | Discharge status: Home / Self Care | Payer: 59 | Source: Ambulatory Visit | Attending: Obstetrics & Gynecology | Admitting: Obstetrics & Gynecology

## 2019-09-06 DIAGNOSIS — Z1231 Encounter for screening mammogram for malignant neoplasm of breast: Secondary | ICD-10-CM

## 2019-09-09 ENCOUNTER — Other Ambulatory Visit: Payer: Self-pay | Admitting: Obstetrics & Gynecology

## 2019-09-10 NOTE — Telephone Encounter (Signed)
Medication refill request: Gabapentin 300mg  #30, 3R Last AEX:  08-18-18 Next AEX: 12-28-19 Last MMG (if hormonal medication request): n/a Refill authorized: please refill if appropriate

## 2019-09-17 ENCOUNTER — Encounter: Payer: Self-pay | Admitting: Certified Nurse Midwife

## 2019-11-08 ENCOUNTER — Other Ambulatory Visit: Payer: Self-pay

## 2019-11-08 ENCOUNTER — Other Ambulatory Visit (HOSPITAL_COMMUNITY)
Admission: RE | Admit: 2019-11-08 | Discharge: 2019-11-08 | Disposition: A | Discharge status: Home / Self Care | Payer: 59 | Source: Ambulatory Visit | Attending: Obstetrics & Gynecology | Admitting: Obstetrics & Gynecology

## 2019-11-08 ENCOUNTER — Encounter: Payer: Self-pay | Admitting: Obstetrics & Gynecology

## 2019-11-08 ENCOUNTER — Ambulatory Visit: Payer: 59 | Admitting: Obstetrics & Gynecology

## 2019-11-08 VITALS — BP 110/70 | HR 68 | Temp 97.3°F | Resp 16 | Ht 64.5 in | Wt 117.0 lb

## 2019-11-08 DIAGNOSIS — Z124 Encounter for screening for malignant neoplasm of cervix: Secondary | ICD-10-CM | POA: Insufficient documentation

## 2019-11-08 DIAGNOSIS — Z01419 Encounter for gynecological examination (general) (routine) without abnormal findings: Secondary | ICD-10-CM | POA: Diagnosis not present

## 2019-11-08 MED ORDER — GABAPENTIN 300 MG PO CAPS: 300.0000 mg | ORAL_CAPSULE | Freq: Every day | ORAL | 3 refills | Status: DC

## 2019-11-08 NOTE — Progress Notes (Signed)
59 y.o. [id number] Married White or Caucasian female here for annual exam.  She is doing well.  Husband had Covid last year and has fully recovered.  Diagnosed with hypothyroidism last year.  Is due to Hashimoto's thyroiditis.  She is followed by Dr. Cruzita Lederer.   Denies vaginal bleeding.     On gabapentin at night for hot flashes.    Patient's last menstrual period was 08/26/2013.          Sexually active: Yes.    The current method of family planning is post menopausal status & husband vasectomy.    Exercising: Yes.    cardio, weights Smoker:  no  Health Maintenance: Pap:  02-27-16 neg HPV HR neg, 08-18-2018 neg History of abnormal Pap:  yes MMG:  09-06-2019 category c density birads 1:neg.  Did breast limited MRI 07/2018 Colonoscopy:  11-06-12 normal BMD:   none TDaP:  2018 Pneumonia vaccine(s):  Not done Shingrix:   Not done Hep C testing: neg 2017 Screening Labs: done 07/2018   reports that she has never smoked. She has never used smokeless tobacco. She reports that she does not drink alcohol or use drugs.  Past Medical History:  Diagnosis Date  . BCC (basal cell carcinoma), face    forehead, stomach, legs, shoulders, arm  . HSV-1 infection     Past Surgical History:  Procedure Laterality Date  . CESAREAN SECTION  1/91  . ENDOMETRIAL ABLATION  4/08  . MOHS SURGERY  09/2011   BCC on forehead, (2021)under left eye & left ear  . TONSILLECTOMY AND ADENOIDECTOMY    . WISDOM TOOTH EXTRACTION      Current Outpatient Medications  Medication Sig Dispense Refill  . Cholecalciferol (VITAMIN D PO) Take 2,000 Int'l Units by mouth daily.     . fluticasone (FLONASE) 50 MCG/ACT nasal spray Place into both nostrils as needed.     . gabapentin (NEURONTIN) 300 MG capsule Take 1 capsule (300 mg total) by mouth at bedtime. 90 capsule 3  . levothyroxine (SYNTHROID) 25 MCG tablet TAKE 1 TABLET (25 MCG TOTAL) BY MOUTH DAILY BEFORE BREAKFAST. 30 tablet 5  . Multiple Vitamin (MULTIVITAMIN PO) Take by  mouth.    . niacinamide 500 MG tablet     . imiquimod (ALDARA) 5 % cream Apply 1 application topically at bedtime.    Marland Kitchen ketoconazole (NIZORAL) 2 % shampoo Apply 1 application topically 2 (two) times a week.     No current facility-administered medications for this visit.    Family History  Problem Relation Age of Onset  . Breast cancer Mother 40  . Diabetes Mother        type II, AODM  . Colon polyps Father   . Thyroid disease Father   . Hypertension Father   . Stroke Father     Review of Systems  Constitutional: Negative.   HENT: Negative.   Eyes: Negative.   Respiratory: Negative.   Cardiovascular: Negative.   Gastrointestinal: Negative.   Endocrine: Negative.   Genitourinary: Negative.   Musculoskeletal: Negative.   Skin: Negative.   Allergic/Immunologic: Negative.   Neurological: Negative.   Psychiatric/Behavioral: Negative.     Exam:   BP 110/70   Pulse 68   Temp (!) 97.3 F (36.3 C) (Skin)   Resp 16   Ht 5' 4.5" (1.638 m)   Wt 117 lb (53.1 kg)   LMP 08/26/2013 Comment: ablation 2008-will have random spotting/bleeding  BMI 19.77 kg/m   Height: 5' 4.5" (163.8 cm)  General  appearance: alert, cooperative and appears stated age Head: Normocephalic, without obvious abnormality, atraumatic Neck: no adenopathy, supple, symmetrical, trachea midline and thyroid normal to inspection and palpation Lungs: clear to auscultation bilaterally Breasts: normal appearance, no masses or tenderness Heart: regular rate and rhythm Abdomen: soft, non-tender; bowel sounds normal; no masses,  no organomegaly Extremities: extremities normal, atraumatic, no cyanosis or edema Skin: Skin color, texture, turgor normal. No rashes or lesions Lymph nodes: Cervical, supraclavicular, and axillary nodes normal. No abnormal inguinal nodes palpated Neurologic: Grossly normal   Pelvic: External genitalia:  no lesions              Urethra:  normal appearing urethra with no masses,  tenderness or lesions              Bartholins and Skenes: normal                 Vagina: normal appearing vagina with normal color and discharge, no lesions              Cervix: no lesions              Pap taken: Yes.   Bimanual Exam:  Uterus:  normal size, contour, position, consistency, mobility, non-tender              Adnexa: normal adnexa and no mass, fullness, tenderness               Rectovaginal: Confirms               Anus:  normal sphincter tone, no lesions  Chaperone, Terence Lux, CMA, was present for exam.  A:  Well Woman with normal exam PMP, no HRT H/o Vit D deficiency Breast cysts H/o endometrial ablation Multiple BCCs H/o uterine fibroids Hashimoto's thyroiditis  P:   Mammogram guidelines reviewed.  Did limited breast MRIs.  Will plan to repeat next year pap smear with HR HPV obtained today Lab work done last year Declines shingrix vaccination and Covid vaccine RF for gabapentin 300mg  nightly.  #90/4RF Return annually or prn

## 2019-11-09 LAB — CYTOLOGY - PAP
Comment: NEGATIVE
Diagnosis: NEGATIVE
High risk HPV: NEGATIVE

## 2019-12-04 ENCOUNTER — Other Ambulatory Visit: Payer: Self-pay | Admitting: Internal Medicine

## 2019-12-28 ENCOUNTER — Ambulatory Visit: Payer: 59 | Admitting: Obstetrics & Gynecology

## 2020-02-22 ENCOUNTER — Other Ambulatory Visit: Payer: Self-pay

## 2020-02-22 ENCOUNTER — Other Ambulatory Visit: Payer: Self-pay | Admitting: Internal Medicine

## 2020-02-22 ENCOUNTER — Other Ambulatory Visit (INDEPENDENT_AMBULATORY_CARE_PROVIDER_SITE_OTHER): Payer: 59

## 2020-02-22 DIAGNOSIS — E063 Autoimmune thyroiditis: Secondary | ICD-10-CM

## 2020-02-22 DIAGNOSIS — E038 Other specified hypothyroidism: Secondary | ICD-10-CM

## 2020-02-22 LAB — T4, FREE: Free T4: 0.83 ng/dL (ref 0.60–1.60)

## 2020-02-22 LAB — TSH: TSH: 4.66 u[IU]/mL — ABNORMAL HIGH (ref 0.35–4.50)

## 2020-02-22 MED ORDER — LEVOTHYROXINE SODIUM 25 MCG PO TABS: 50.0000 ug | ORAL_TABLET | Freq: Every day | ORAL | 3 refills | Status: DC

## 2020-04-09 ENCOUNTER — Other Ambulatory Visit: Payer: Self-pay

## 2020-04-09 ENCOUNTER — Other Ambulatory Visit (INDEPENDENT_AMBULATORY_CARE_PROVIDER_SITE_OTHER): Payer: 59

## 2020-04-09 DIAGNOSIS — E063 Autoimmune thyroiditis: Secondary | ICD-10-CM | POA: Diagnosis not present

## 2020-04-09 DIAGNOSIS — E038 Other specified hypothyroidism: Secondary | ICD-10-CM | POA: Diagnosis not present

## 2020-04-09 LAB — T4, FREE: Free T4: 0.88 ng/dL (ref 0.60–1.60)

## 2020-04-09 LAB — TSH: TSH: 2.25 u[IU]/mL (ref 0.35–4.50)

## 2020-04-10 ENCOUNTER — Encounter: Payer: Self-pay | Admitting: Internal Medicine

## 2020-04-11 MED ORDER — LEVOTHYROXINE SODIUM 50 MCG PO TABS: 50.0000 ug | ORAL_TABLET | Freq: Every day | ORAL | 3 refills | Status: DC

## 2020-04-14 ENCOUNTER — Encounter: Payer: Self-pay | Admitting: Internal Medicine

## 2020-04-15 MED ORDER — LEVOTHYROXINE SODIUM 50 MCG PO TABS: 50.0000 ug | ORAL_TABLET | Freq: Every day | ORAL | 3 refills | Status: DC

## 2020-05-11 ENCOUNTER — Other Ambulatory Visit: Payer: Self-pay | Admitting: Internal Medicine

## 2020-05-14 NOTE — Telephone Encounter (Signed)
Rx for Levothyroxine 50 MCG was sent to patients pharmacy on 04/15/2020. Please advise.

## 2020-07-31 ENCOUNTER — Other Ambulatory Visit: Payer: Self-pay | Admitting: Obstetrics & Gynecology

## 2020-07-31 DIAGNOSIS — Z1231 Encounter for screening mammogram for malignant neoplasm of breast: Secondary | ICD-10-CM

## 2020-08-05 IMAGING — US ULTRASOUND LEFT BREAST LIMITED
1 series · 12 of 12 positions shown · non-contrast
Comparison: Bilateral screening breast MRI August 24, 2018

CLINICAL DATA: 58-year-old patient recently had an abbreviated
screening breast MRI, and was recalled for a second-look breast
ultrasound in an attempt to identify adjacent cysts in the
upper-outer quadrant. A 1.3 cm greatest diameter ring-enhancing mass
seen on MRI is favored to represent a benign cyst with pericystic
inflammation and enhancement. Second-look targeted ultrasound was
recommended to confirm benignity. The patient states that she has
had some tenderness in the upper-outer breast recently, which is
improving.

Family history of breast cancer in her mother. The patient has never
had a breast biopsy and no history of high risk lesions. She has a
history of breast cysts and dense breast tissue. She has had cysts
aspirated in the past.
EXAM:
ULTRASOUND OF THE LEFT BREAST

[Series 1: ultrasound left breast limited · 0.06mm/px · 12 of 12 slices shown]
[im 1/12]
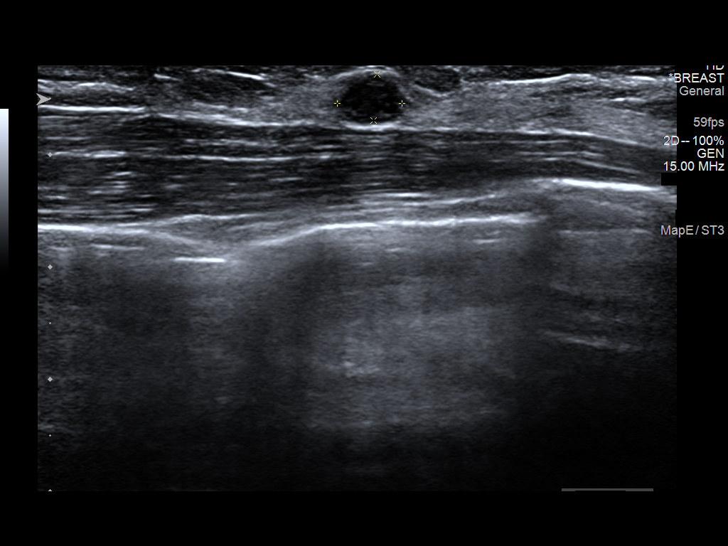
[im 2/12]
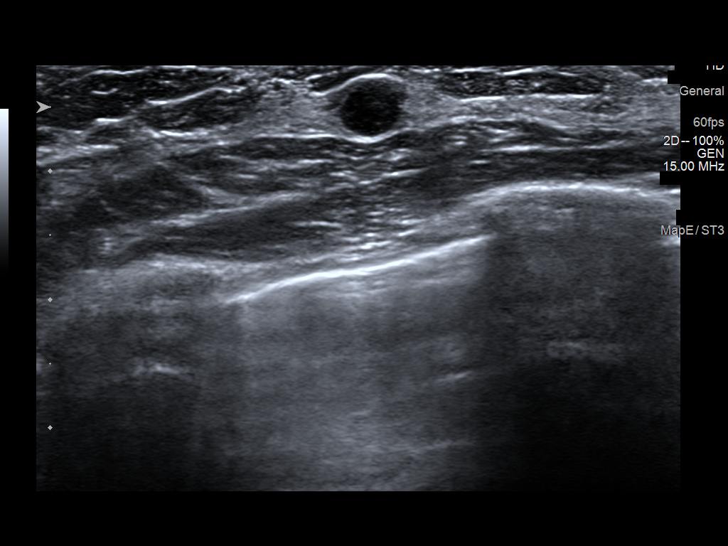
[im 3/12]
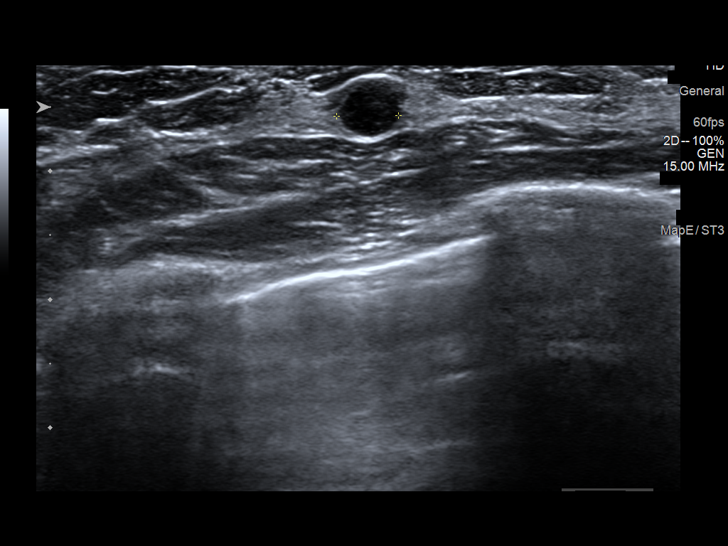
[im 4/12]
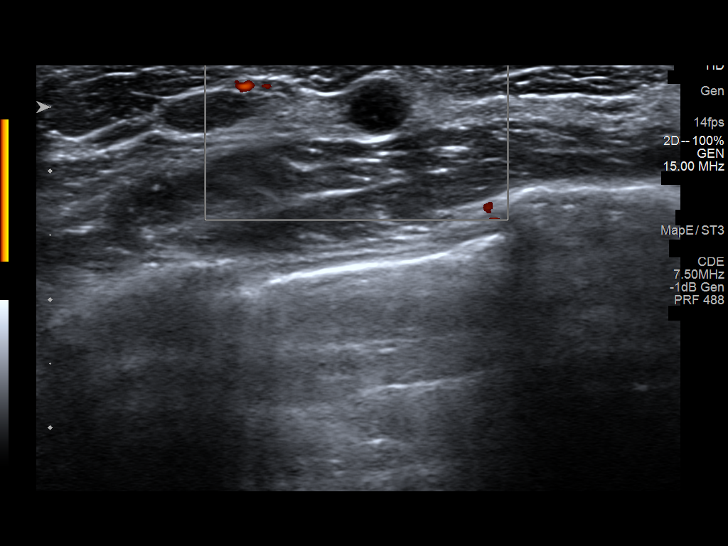
[im 5/12]
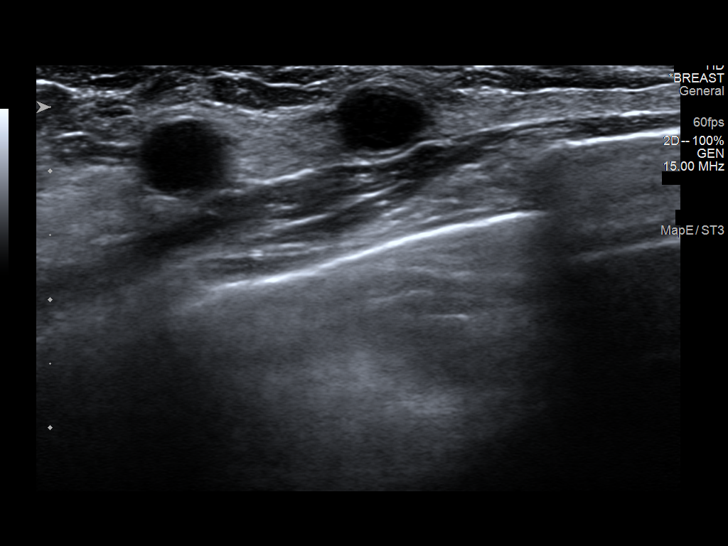
[im 6/12]
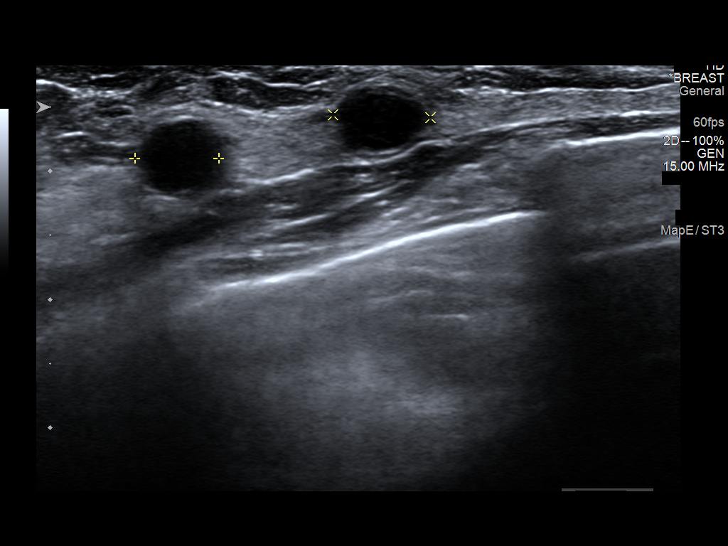
[im 7/12]
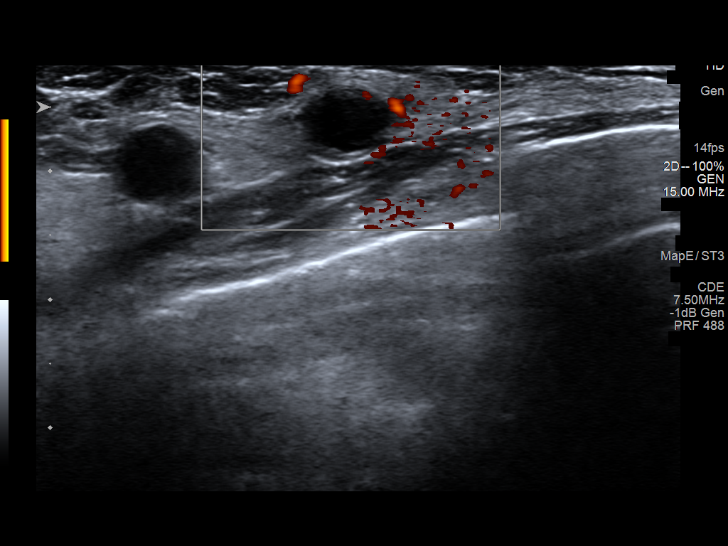
[im 8/12]
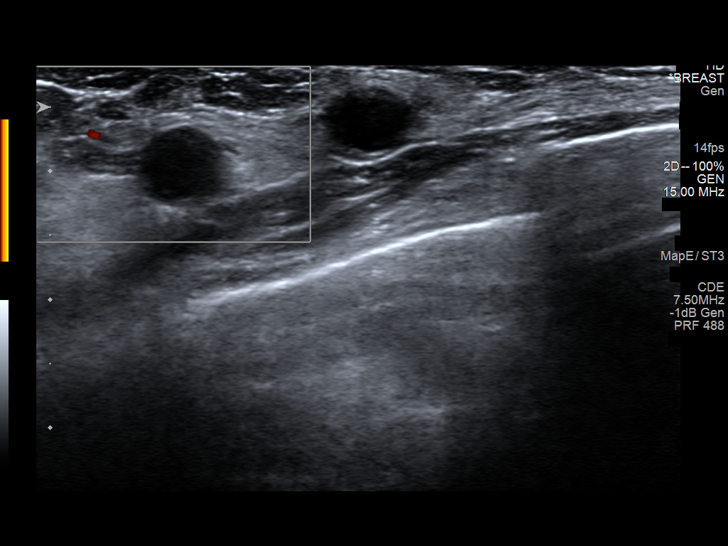
[im 9/12]
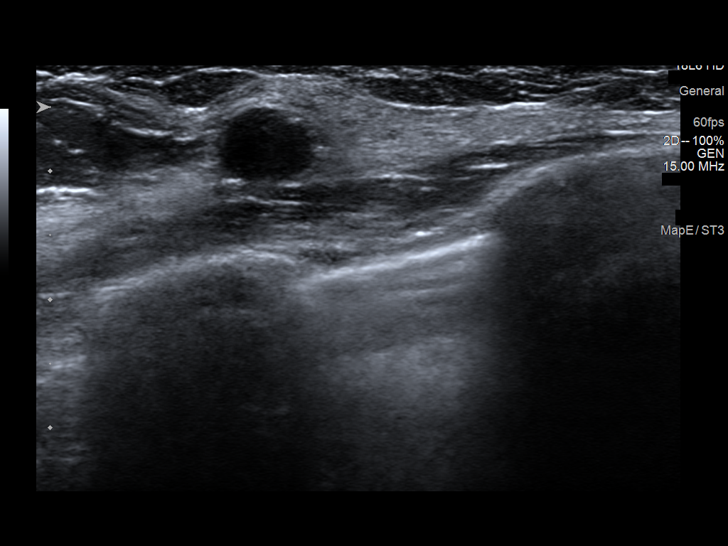
[im 10/12]
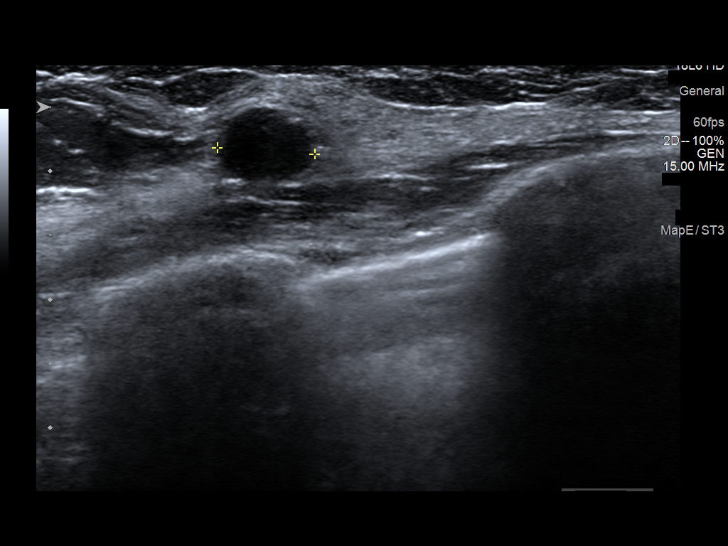
[im 11/12]
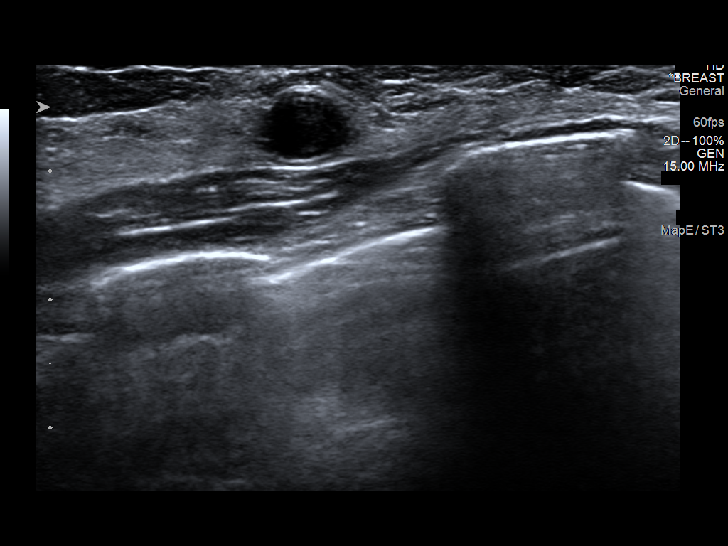
[im 12/12]
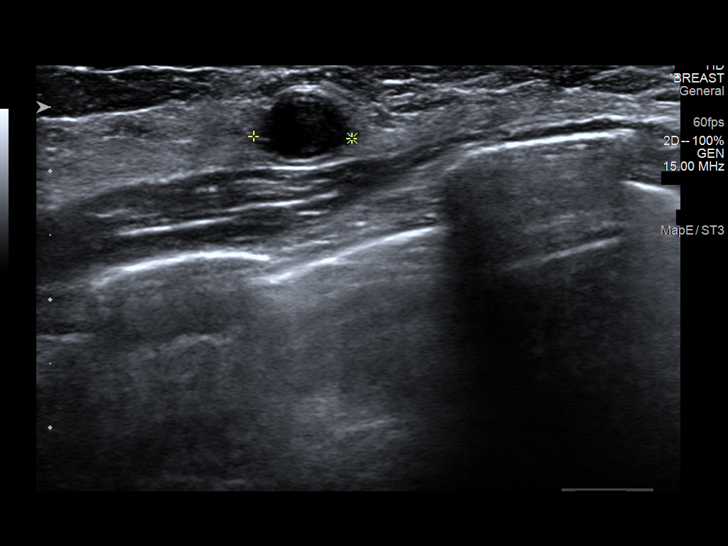

[12 of 12 positions shown; findings below may reference images not displayed]

FINDINGS: On physical exam, I do palpate a small superficial lump
approximately 5 mm in the axillary tail of the left breast. More
inferiorly in the [DATE] position of the left breast 4-6 cm from the
nipple is mild palpable smooth nodularity.

Targeted ultrasound is performed, showing 2 adjacent cysts in the
[DATE] position of the left breast both visible on the same ultrasound
image. The cyst more distal to the nipple has a mildly thickened
wall around its entire circumference, and measures 0.8 x 0.6 x
cm. The cyst adjacent to it, which is slightly closer to the nipple
measures 0.8 cm greatest diameter and demonstrates slight thickening
of the superior wall. Both of the cysts have some internal debris
and demonstrate posterior acoustic enhancement. There is focal color
Doppler flow within a portion of thickened wall of the more distal
cyst.

In the axillary tail of the left breast at the site of a palpable
lump is a similar appearing mildly complicated cyst measuring 0.6 x
0.5 x 0.4 cm. No suspicious masses identified in the outer left
breast.
IMPRESSION: Two adjacent cysts in the [DATE] position of the left breast are
identified, the more distal of which has a peripherally mildly
thickened wall consistent with an inflamed cyst and correlating well
with the rim-enhancing mass felt to be a benign cyst with pericystic
inflammation/enhancement on MRI.

RECOMMENDATION:
Screening mammography is recommended in July 2019.

Screening breast MRI can be performed in the future, as desired by
the patient.

I have discussed the findings and recommendations with the patient.
Results were also provided in writing at the conclusion of the
visit. If applicable, a reminder letter will be sent to the patient
regarding the next appointment.

BI-RADS CATEGORY  2: Benign.

## 2020-08-20 ENCOUNTER — Encounter: Payer: Self-pay | Admitting: Internal Medicine

## 2020-08-22 ENCOUNTER — Ambulatory Visit: Payer: 59 | Admitting: Internal Medicine

## 2020-08-22 ENCOUNTER — Other Ambulatory Visit: Payer: Self-pay | Admitting: Internal Medicine

## 2020-08-22 ENCOUNTER — Encounter: Payer: Self-pay | Admitting: Internal Medicine

## 2020-08-22 ENCOUNTER — Other Ambulatory Visit: Payer: Self-pay

## 2020-08-22 VITALS — BP 110/68 | HR 80 | Ht 64.5 in | Wt 120.8 lb

## 2020-08-22 DIAGNOSIS — E038 Other specified hypothyroidism: Secondary | ICD-10-CM | POA: Diagnosis not present

## 2020-08-22 DIAGNOSIS — E063 Autoimmune thyroiditis: Secondary | ICD-10-CM

## 2020-08-22 LAB — TSH: TSH: 4.23 u[IU]/mL (ref 0.35–4.50)

## 2020-08-22 LAB — T4, FREE: Free T4: 0.84 ng/dL (ref 0.60–1.60)

## 2020-08-22 NOTE — Progress Notes (Signed)
Patient ID: Christina Wallace, female   DOB: 1960-09-05, 60 y.o.   MRN: [id number]   This visit occurred during the SARS-CoV-2 public health emergency.  Safety protocols were in place, including screening questions prior to the visit, additional usage of staff PPE, and extensive cleaning of exam room while observing appropriate contact time as indicated for disinfecting solutions.   HPI  Christina Wallace is a 60 y.o.-year-old female, initially referred by her OB/GYN provider, Dr. Hale Bogus, now returning for follow-up for hypothyroidism due to Hashimoto's thyroiditis.  Last visit 1 year ago.  Her daughter has 2 jobs now and she is keeping the children, who are 60 and 4 years old. She feels more tired.  Reviewed and addended history: Pt. has been found to have a slightly abnormal TSH in 10/2013.  However, the test normalized afterwards with level in the upper range of normal.  At last check with Dr. Sabra Heck in 07/2018, TSH is increased to 7.18.  A free T4 level was also low 2 days later. She was referred to endocrinology at that time.   In  08/2018, her TSH was close to 10 and I suggested to start levothyroxine.  However, she wanted to have the TFTs repeated and another TSH obtained 3 months later was still high.   In 11/2018 we checked her antithyroid antibodies and they were positive, giving her a diagnosis of Hashimoto's thyroiditis.  She tried selenium supplement, gluten-free diet in an effort to avoid starting levothyroxine, however, with no changes in TFTs.   In 11/2018, she agreed to start levothyroxine, which she continues now at 25 mcg daily.  On this dose, TFTs normalized.  In 01/2020, we increased the levothyroxine dose to 50 mcg daily.  Pt takes levothyroxine: - in am - fasting - 1-2h from b'fast - no calcium - no iron - + multivitamins around lunchtime-very low iron, no calcium, 160 mcg biotin - no PPIs  Reviewed her TFTs: Lab Results  Component Value Date   TSH 2.25 04/09/2020    TSH 4.66 (H) 02/22/2020   TSH 3.15 08/24/2019   TSH 4.18 04/18/2019   TSH 3.33 02/13/2019   TSH 9.68 (H) 12/18/2018   TSH 9.11 (H) 09/18/2018   TSH 7.180 (H) 08/18/2018   TSH 3.95 02/27/2016   TSH 3.881 12/02/2014   FREET4 0.88 04/09/2020   FREET4 0.83 02/22/2020   FREET4 0.76 08/24/2019   FREET4 0.68 04/18/2019   FREET4 0.85 02/13/2019   FREET4 0.77 12/18/2018   FREET4 0.64 09/18/2018   FREET4 0.81 (L) 08/23/2018   T3FREE 3.1 02/13/2019   T3FREE 3.1 12/18/2018   T3FREE 3.9 09/18/2018    Reviewed previous antibody levels: Component     Latest Ref Rng & Units 12/18/2018  Thyroperoxidase Ab SerPl-aCnc     <9 IU/mL 293 (H)   Component     Latest Ref Rng & Units 09/18/2018  Thyroperoxidase Ab SerPl-aCnc     <9 IU/mL 353 (H)  Thyroglobulin Ab     < or = 1 IU/mL <1   Pt denies: - feeling nodules in neck - hoarseness - dysphagia - choking - SOB with lying down  She has + FH of thyroid disorders in: father (thyroid nodule), mother, sister, daughter - during pregnancy.  No family history of thyroid cancer.  No history of radiation therapy to head or neck. No herbal supplements, biotin, steroids.  Pt. also has a history of basal cell tumors  - on Niacinamide. She is on vitamin D supplements.  She has a h/o eosonophilic esophagitis by EGD in ~2008. She continues to exercise consistently, cardio and weights.  ROS: Constitutional: no weight gain/no weight loss, + fatigue, no subjective hyperthermia, no subjective hypothermia Eyes: no blurry vision, no xerophthalmia ENT: no sore throat, + see HPI Cardiovascular: no CP/no SOB/no palpitations/no leg swelling Respiratory: no cough/no SOB/no wheezing Gastrointestinal: no N/no V/no D/no C/no acid reflux Musculoskeletal: no muscle aches/no joint aches Skin: no rashes, no hair loss Neurological: no tremors/no numbness/no tingling/no dizziness  I reviewed pt's medications, allergies, PMH, social hx, family hx, and changes were  documented in the history of present illness. Otherwise, unchanged from my initial visit note.  Past Medical History:  Diagnosis Date  . BCC (basal cell carcinoma), face    forehead, stomach, legs, shoulders, arm  . HSV-1 infection    Past Surgical History:  Procedure Laterality Date  . CESAREAN SECTION  1/91  . ENDOMETRIAL ABLATION  4/08  . MOHS SURGERY  09/2011   BCC on forehead, (2021)under left eye & left ear  . TONSILLECTOMY AND ADENOIDECTOMY    . WISDOM TOOTH EXTRACTION     Social History   Socioeconomic History  . Marital status: Married    Spouse name: Not on file  . Number of children: 2  . Years of education: Not on file  . Highest education level: Not on file  Occupational History  . Not on file  Social Needs  . Financial resource strain: Not on file  . Food insecurity:    Worry: Not on file    Inability: Not on file  . Transportation needs:    Medical: Not on file    Non-medical: Not on file  Tobacco Use  . Smoking status: Never Smoker  . Smokeless tobacco: Never Used  Substance and Sexual Activity  . Alcohol use: No    Frequency: Never  . Drug use: No   Current Outpatient Medications on File Prior to Visit  Medication Sig Dispense Refill  . Cholecalciferol (VITAMIN D PO) Take 2,000 Int'l Units by mouth daily.     . fluticasone (FLONASE) 50 MCG/ACT nasal spray Place into both nostrils as needed.     . gabapentin (NEURONTIN) 300 MG capsule Take 1 capsule (300 mg total) by mouth at bedtime. 90 capsule 3  . imiquimod (ALDARA) 5 % cream Apply 1 application topically at bedtime.    Marland Kitchen ketoconazole (NIZORAL) 2 % shampoo Apply 1 application topically 2 (two) times a week.    . levothyroxine (SYNTHROID) 50 MCG tablet Take 1 tablet (50 mcg total) by mouth daily before breakfast. 90 tablet 3  . Multiple Vitamin (MULTIVITAMIN PO) Take by mouth.    . niacinamide 500 MG tablet      No current facility-administered medications on file prior to visit.   Allergies   Allergen Reactions  . Codeine Nausea And Vomiting  . Sulfa Antibiotics Rash   Family History  Problem Relation Age of Onset  . Breast cancer Mother 83  . Diabetes Mother        type II, AODM  . Colon polyps Father   . Thyroid disease Father   . Hypertension Father   . Stroke Father   Also, sister with HTN. Please also see HPI.  PE: LMP 08/26/2013 Comment: ablation 2008-will have random spotting/bleeding Wt Readings from Last 3 Encounters:  11/08/19 117 lb (53.1 kg)  08/24/19 120 lb (54.4 kg)  02/16/19 119 lb (54 kg)   Constitutional: normal weight, in NAD Eyes:  PERRLA, EOMI, no exophthalmos ENT: moist mucous membranes, no thyromegaly, no cervical lymphadenopathy Cardiovascular: RRR, No MRG Respiratory: CTA B Gastrointestinal: abdomen soft, NT, ND, BS+ Musculoskeletal: no deformities, strength intact in all 4 Skin: moist, warm, no rashes Neurological: no tremor with outstretched hands, DTR normal in all 4  ASSESSMENT: 1. Hypothyroidism 2/2 Hashimoto's thyroiditis -She was on selenium in the past but she did not feel a difference, so we stopped it  PLAN:  1. Patient with history of hypothyroidism, diagnosed as due to Hashimoto's thyroiditis in 11/2018 when her thyroid antibodies returned elevated.  On generic levothyroxine. - latest thyroid labs reviewed with pt >> normal: Lab Results  Component Value Date   TSH 2.25 04/09/2020   - she continues on LT4 50 mcg daily (dose increased 01/2020) -she does not feel differently after changing the dose - pt feels good on this dose except for fatigue due to keeping her grandchildren every day. - we discussed about taking the thyroid hormone every day, with water, >30 minutes before breakfast, separated by >4 hours from acid reflux medications, calcium, iron, multivitamins. Pt. is taking it correctly.  She takes multivitamins only 2 hours after levothyroxine but they contain a very small amount of iron and no calcium. - will check  thyroid tests today: TSH and fT4 - If labs are abnormal, she will need to return for repeat TFTs in 1.5 months - I will see her back in 1 year, but with labs in 6 months  Component     Latest Ref Rng & Units 08/22/2020  TSH     0.35 - 4.50 uIU/mL 4.23  T4,Free(Direct)     0.60 - 1.60 ng/dL 0.84  Normal TFTs. Philemon Kingdom, MD PhD [facility]

## 2020-08-22 NOTE — Patient Instructions (Signed)
Please continue Levothyroxine 50 mcg daily.  Take the thyroid hormone every day, with water, at least 30 minutes before breakfast, separated by at least 4 hours from: - acid reflux medications - calcium - iron - multivitamins  Please stop at the lab.  Please come back for a follow-up appointment in 1 year and labs in 6 months.

## 2020-09-12 ENCOUNTER — Inpatient Hospital Stay: Admission: RE | Admit: 2020-09-12 | Payer: 59 | Source: Ambulatory Visit

## 2020-09-17 ENCOUNTER — Ambulatory Visit
Admission: RE | Admit: 2020-09-17 | Discharge: 2020-09-17 | Disposition: A | Discharge status: Home / Self Care | Payer: 59 | Source: Ambulatory Visit | Attending: Obstetrics & Gynecology | Admitting: Obstetrics & Gynecology

## 2020-09-17 ENCOUNTER — Other Ambulatory Visit: Payer: Self-pay

## 2020-09-17 DIAGNOSIS — Z1231 Encounter for screening mammogram for malignant neoplasm of breast: Secondary | ICD-10-CM

## 2020-11-04 ENCOUNTER — Ambulatory Visit: Payer: 59

## 2020-11-11 ENCOUNTER — Ambulatory Visit (HOSPITAL_BASED_OUTPATIENT_CLINIC_OR_DEPARTMENT_OTHER): Payer: 59 | Admitting: Obstetrics & Gynecology

## 2020-11-12 ENCOUNTER — Other Ambulatory Visit: Payer: Self-pay

## 2020-11-12 ENCOUNTER — Ambulatory Visit (INDEPENDENT_AMBULATORY_CARE_PROVIDER_SITE_OTHER): Payer: 59 | Admitting: Obstetrics & Gynecology

## 2020-11-12 ENCOUNTER — Encounter (HOSPITAL_BASED_OUTPATIENT_CLINIC_OR_DEPARTMENT_OTHER): Payer: Self-pay | Admitting: Obstetrics & Gynecology

## 2020-11-12 VITALS — BP 110/58 | HR 75 | Ht 63.0 in | Wt 119.0 lb

## 2020-11-12 DIAGNOSIS — Z9189 Other specified personal risk factors, not elsewhere classified: Secondary | ICD-10-CM

## 2020-11-12 DIAGNOSIS — Z01419 Encounter for gynecological examination (general) (routine) without abnormal findings: Secondary | ICD-10-CM

## 2020-11-12 DIAGNOSIS — E063 Autoimmune thyroiditis: Secondary | ICD-10-CM

## 2020-11-12 DIAGNOSIS — M25551 Pain in right hip: Secondary | ICD-10-CM

## 2020-11-12 DIAGNOSIS — R232 Flushing: Secondary | ICD-10-CM

## 2020-11-12 DIAGNOSIS — Z9889 Other specified postprocedural states: Secondary | ICD-10-CM

## 2020-11-12 DIAGNOSIS — Z780 Asymptomatic menopausal state: Secondary | ICD-10-CM | POA: Diagnosis not present

## 2020-11-12 MED ORDER — GABAPENTIN 300 MG PO CAPS: 300.0000 mg | ORAL_CAPSULE | Freq: Every day | ORAL | 3 refills | Status: DC

## 2020-11-12 NOTE — Progress Notes (Signed)
60 y.o. [id number] Married White or Caucasian female here for annual exam.  Still having hot flashes.  Is on gabapentin.  Is taking 3000 IU Vit D.  Has lifetime risk of breast cancer of 19%.  Did abbreviated breast MRI in 2020.  Desires to repeat this year.  Has noticed increased right hip pain more at night with sleeping.  Has some scoliosis.    Denies vaginal bleeding.    Patient's last menstrual period was 08/26/2013.          Sexually active: Yes.    The current method of family planning is abstinence.    Exercising: Yes.   Smoker:  no  Health Maintenance: Pap:  11/08/2019 neg with neg HR HPV History of abnormal Pap:  no MMG:  09/16/2020 Colonoscopy:  10/2012 BMD:   Around age 49 TDaP:  2018 Shingrix:   discussed Hep C testing: 2017 Screening Labs: 2020   reports that she has never smoked. She has never used smokeless tobacco. She reports that she does not drink alcohol and does not use drugs.  Past Medical History:  Diagnosis Date  . BCC (basal cell carcinoma), face    forehead, stomach, legs, shoulders, arm  . HSV-1 infection     Past Surgical History:  Procedure Laterality Date  . CESAREAN SECTION  1/91  . ENDOMETRIAL ABLATION  4/08  . MOHS SURGERY  09/2011   BCC on forehead, (2021)under left eye & left ear  . TONSILLECTOMY AND ADENOIDECTOMY    . WISDOM TOOTH EXTRACTION      Current Outpatient Medications  Medication Sig Dispense Refill  . Cholecalciferol (VITAMIN D PO) Take 2,000 Int'l Units by mouth daily.     . fluticasone (FLONASE) 50 MCG/ACT nasal spray Place into both nostrils as needed.     . gabapentin (NEURONTIN) 300 MG capsule Take 1 capsule (300 mg total) by mouth at bedtime. 90 capsule 3  . levothyroxine (SYNTHROID) 50 MCG tablet Take 1 tablet (50 mcg total) by mouth daily before breakfast. 90 tablet 3  . Multiple Vitamin (MULTIVITAMIN PO) Take by mouth.    . niacinamide 500 MG tablet      No current facility-administered medications for this visit.     Family History  Problem Relation Age of Onset  . Breast cancer Mother 43  . Diabetes Mother        type II, AODM  . Colon polyps Father   . Thyroid disease Father   . Hypertension Father   . Stroke Father     Review of Systems  Musculoskeletal: Positive for arthralgias (right hip pain).  All other systems reviewed and are negative.   Exam:   BP (!) 110/58   Pulse 75   Ht 5\' 3"  (1.6 m)   Wt 119 lb (54 kg)   LMP 08/26/2013 Comment: ablation 2008-will have random spotting/bleeding  BMI 21.08 kg/m   Height: 5\' 3"  (160 cm)    General appearance: alert, cooperative and appears stated age Head: Normocephalic, without obvious abnormality, atraumatic Neck: no adenopathy, supple, symmetrical, trachea midline and thyroid normal to inspection and palpation Lungs: clear to auscultation bilaterally Breasts: normal appearance, no masses or tenderness Heart: regular rate and rhythm Abdomen: soft, non-tender; bowel sounds normal; no masses,  no organomegaly Extremities: extremities normal, atraumatic, no cyanosis or edema Skin: Skin color, texture, turgor normal. No rashes or lesions Lymph nodes: Cervical, supraclavicular, and axillary nodes normal. No abnormal inguinal nodes palpated Neurologic: Grossly normal   Pelvic: External genitalia:  no lesions              Urethra:  normal appearing urethra with no masses, tenderness or lesions              Bartholins and Skenes: normal                 Vagina: normal appearing vagina with normal color and no discharge, no lesions              Cervix: no lesions              Pap taken: No. Bimanual Exam:  Uterus:  normal size, contour, position, consistency, mobility, non-tender              Adnexa: normal adnexa and no mass, fullness, tenderness               Rectovaginal: Confirms               Anus:  normal sphincter tone, no lesions  Chaperone, Octaviano Batty, CMA, was present for exam.  Assessment/Plan: 1. Well woman exam with  routine gynecological exam - pap 11/08/2018 neg with neg HR HPV - MMG 09/16/2020 - Plan BMD around age 28 - colonoscopy 2014 - lab work 2020 - vaccines updated  2. Right hip pain - AMB referral to sports medicine  3. Postmenopausal - HRT  4. Hot flashes - gabapentin RF completed  5. Increased risk of breast cancer - plan abbreviated breast MRI this year  6. History of endometrial ablation  7. Hashimoto's thyroiditis - followed by Dr. Cruzita Lederer

## 2020-12-15 NOTE — Progress Notes (Signed)
[place] Phone: [phone] Subjective:   Christina Wallace, am serving as a scribe for Dr. Hulan Saas. This visit occurred during the SARS-CoV-2 public health emergency.  Safety protocols were in place, including screening questions prior to the visit, additional usage of staff PPE, and extensive cleaning of exam room while observing appropriate contact time as indicated for disinfecting solutions.    I'm seeing this patient by the request  of: Dr. Hale Bogus  CC: Multiple complaints  [id number]  Christina Wallace is a 60 y.o. female coming in with complaint of R hip pain for a few years. Having a hard time sleeping due to pain over greater trochanter. Does a lot of stretching to help with her pain.   Also complaining of L thumb CMC joint hypermobility. Does HEP for thumb. Notes that she was playing tug of war with large dogs one week ago and she fell back bracing herself. Is now having pain in wrist and thumb.   Patient notes having L sided anterior rib pain. Feels a lump in her chest which her PCP said might be caused by posture and spinal alignment.   Patient notes history of runner's knee but she does not run.   Also notes pain in her feet and wonders if new shoes from [employer] is causing pain as she is more comfortable in sandals.       Past Medical History:  Diagnosis Date   BCC (basal cell carcinoma), face    forehead, stomach, legs, shoulders, arm   HSV-1 infection    Past Surgical History:  Procedure Laterality Date   CESAREAN SECTION  1/91   ENDOMETRIAL ABLATION  4/08   MOHS SURGERY  09/2011   BCC on forehead, (2021)under left eye & left ear   TONSILLECTOMY AND ADENOIDECTOMY     WISDOM TOOTH EXTRACTION     Social History   Socioeconomic History   Marital status: Married    Spouse name: Not on file   Number of children: Not on file   Years of education: Not on file   Highest  education level: Not on file  Occupational History   Not on file  Tobacco Use   Smoking status: Never   Smokeless tobacco: Never  Vaping Use   Vaping Use: Never used  Substance and Sexual Activity   Alcohol use: Wallace   Drug use: Wallace   Sexual activity: Yes    Partners: Male    Birth control/protection: Surgical    Comment: husband vasectomy  Other Topics Concern   Not on file  Social History Narrative   Not on file   Social Determinants of Health   Financial Resource Strain: Not on file  Food Insecurity: Not on file  Transportation Needs: Not on file  Physical Activity: Not on file  Stress: Not on file  Social Connections: Not on file   Allergies  Allergen Reactions   Codeine Nausea And Vomiting   Sulfa Antibiotics Rash   Family History  Problem Relation Age of Onset   Breast cancer Mother 7   Diabetes Mother        type II, AODM   Colon polyps Father    Thyroid disease Father    Hypertension Father    Stroke Father     Current Outpatient Medications (Endocrine & Metabolic):    levothyroxine (SYNTHROID) 50 MCG tablet, Take 1 tablet (50 mcg total) by mouth daily before breakfast.  Current Outpatient Medications (Respiratory):    fluticasone (FLONASE) 50 MCG/ACT nasal spray, Place into both nostrils as needed.     Current Outpatient Medications (Other):    Cholecalciferol (VITAMIN D PO), Take 2,000 Int'l Units by mouth daily.    gabapentin (NEURONTIN) 300 MG capsule, Take 1 capsule (300 mg total) by mouth at bedtime.   Multiple Vitamin (MULTIVITAMIN PO), Take by mouth.   niacinamide 500 MG tablet,    Reviewed prior external information including notes and imaging from  primary care provider As well as notes that were available from care everywhere and other healthcare systems.  Past medical history, social, surgical and family history all reviewed in electronic medical record.  Wallace pertanent information unless stated regarding to the chief complaint.    Review of Systems:  Wallace headache, visual changes, nausea, vomiting, diarrhea, constipation, dizziness, abdominal pain, skin rash, fevers, chills, night sweats, weight loss, swollen lymph nodes,  joint swelling, chest pain, shortness of breath, mood changes. POSITIVE muscle aches, body aches  Objective  Blood pressure 110/70, pulse 92, height 5\' 3"  (1.6 m), weight 112 lb (50.8 kg), last menstrual period 08/26/2013, SpO2 99 %.   General: Wallace apparent distress alert and oriented x3 mood and affect normal, dressed appropriately.  HEENT: Pupils equal, extraocular movements intact  Respiratory: Patient's speak in full sentences and does not appear short of breath  Cardiovascular: Wallace lower extremity edema, non tender, Wallace erythema  Gait normal with good balance and coordination.  MSK: Hip exam shows patient does have tenderness to palpation over the greater trochanteric area on the right side.  Patient does have positive Corky Sox.  Negative straight leg test.  Patient's left thumb does have some mild findings that is consistent with de Quervain's.  Negative CMC joint.  Patient has what appears to be potentially a small ganglion cyst noted on the radial aspect on the palmar side.  Minorly tender.  Patient does have abnormality noted of the chest wall on the left side.  Seems to be more of a muscle.  Nontender to palpation.  Wallace soft tissue aspect to it.  Seems to move with the rib cage.   [id number]; 15 additional minutes spent for Therapeutic exercises as stated in above notes.  This included exercises focusing on stretching, strengthening, with significant focus on eccentric aspects.   Long term goals include an improvement in range of motion, strength, endurance as well as avoiding reinjury. Patient's frequency would include in 1-2 times a day, 3-5 times a week for a duration of 6-12 weeks.  Hip strengthening exercises which included:  Pelvic tilt/bracing to help with proper recruitment of the lower abs and  pelvic floor muscles  Glute strengthening to properly contract glutes without over-engaging low back and hamstrings - prone hip extension and glute bridge exercises Proper stretching techniques to increase effectiveness for the hip flexors, groin, quads, piriformic and low back when appropriate  Proper technique shown and discussed handout in great detail with ATC.  All questions were discussed and answered.     Impression and Recommendations:     The above documentation has been reviewed and is accurate and complete [name], DO

## 2020-12-16 ENCOUNTER — Encounter: Payer: Self-pay | Admitting: Family Medicine

## 2020-12-16 ENCOUNTER — Ambulatory Visit: Payer: 59 | Admitting: Family Medicine

## 2020-12-16 ENCOUNTER — Ambulatory Visit (INDEPENDENT_AMBULATORY_CARE_PROVIDER_SITE_OTHER): Payer: 59

## 2020-12-16 ENCOUNTER — Other Ambulatory Visit: Payer: Self-pay

## 2020-12-16 VITALS — BP 110/70 | HR 92 | Ht 63.0 in | Wt 112.0 lb

## 2020-12-16 DIAGNOSIS — M25552 Pain in left hip: Secondary | ICD-10-CM

## 2020-12-16 DIAGNOSIS — R0789 Other chest pain: Secondary | ICD-10-CM | POA: Diagnosis not present

## 2020-12-16 DIAGNOSIS — M79642 Pain in left hand: Secondary | ICD-10-CM

## 2020-12-16 DIAGNOSIS — M25551 Pain in right hip: Secondary | ICD-10-CM

## 2020-12-16 DIAGNOSIS — M546 Pain in thoracic spine: Secondary | ICD-10-CM

## 2020-12-16 NOTE — Patient Instructions (Signed)
Exercises hip  Pelvis xray today Thoracic xray Sternal xray Continue Vit D Tart Cherry Extract  Pennsaid  See me again in 6 weeks if still painful will do injection

## 2020-12-16 NOTE — Assessment & Plan Note (Signed)
Mild abnormality noted.  Could be secondary to potential rotation and could be a candidate for possible osteopathic manipulation but we will get laboratory work-up first.  Given exercises to further evaluate patient's thoracic spine.  Patient has had mammograms and all have been unremarkable.  Patient is doing self breast exams as well and has not noticed any true masses.  No fevers chills or any abnormal weight loss.  No pain that seems to be worse with activity or radiation down the arm.  We will continue to monitor closely and patient will follow up again in 6 weeks

## 2020-12-16 NOTE — Assessment & Plan Note (Signed)
Patient is having right hip pain that seems to be consistent with more of a greater trochanteric bursitis.  Encourage patient to continue the gabapentin but we discussed the possibility as well of topical anti-inflammatories, icing regimen.  Patient declined having the injection today.  Patient will have x-rays done.  Depending on how patient responds we will consider the possibility of formal physical therapy versus possible injections at follow-up.

## 2020-12-29 ENCOUNTER — Encounter (HOSPITAL_BASED_OUTPATIENT_CLINIC_OR_DEPARTMENT_OTHER): Payer: Self-pay

## 2020-12-29 ENCOUNTER — Encounter: Payer: Self-pay | Admitting: Family Medicine

## 2020-12-30 ENCOUNTER — Other Ambulatory Visit (HOSPITAL_BASED_OUTPATIENT_CLINIC_OR_DEPARTMENT_OTHER): Payer: Self-pay | Admitting: Obstetrics & Gynecology

## 2020-12-30 DIAGNOSIS — Z9189 Other specified personal risk factors, not elsewhere classified: Secondary | ICD-10-CM

## 2021-01-09 ENCOUNTER — Ambulatory Visit
Admission: RE | Admit: 2021-01-09 | Discharge: 2021-01-09 | Disposition: A | Discharge status: Home / Self Care | Payer: Self-pay | Source: Ambulatory Visit | Attending: Obstetrics & Gynecology | Admitting: Obstetrics & Gynecology

## 2021-01-09 ENCOUNTER — Other Ambulatory Visit: Payer: Self-pay

## 2021-01-09 DIAGNOSIS — Z9189 Other specified personal risk factors, not elsewhere classified: Secondary | ICD-10-CM

## 2021-01-09 MED ORDER — GADOBENATE DIMEGLUMINE 529 MG/ML IV SOLN
20.0000 mL | Freq: Once | INTRAVENOUS | Status: AC | PRN
  Administered 2021-01-09: 20 mL via INTRAVENOUS

## 2021-01-11 ENCOUNTER — Encounter (HOSPITAL_BASED_OUTPATIENT_CLINIC_OR_DEPARTMENT_OTHER): Payer: Self-pay

## 2021-01-12 ENCOUNTER — Ambulatory Visit: Payer: 59

## 2021-01-28 ENCOUNTER — Other Ambulatory Visit: Payer: Self-pay

## 2021-01-28 ENCOUNTER — Encounter: Payer: Self-pay | Admitting: Family Medicine

## 2021-01-28 ENCOUNTER — Ambulatory Visit: Payer: 59 | Admitting: Family Medicine

## 2021-01-28 DIAGNOSIS — R0789 Other chest pain: Secondary | ICD-10-CM

## 2021-01-28 DIAGNOSIS — M216X9 Other acquired deformities of unspecified foot: Secondary | ICD-10-CM | POA: Diagnosis not present

## 2021-01-28 DIAGNOSIS — M25551 Pain in right hip: Secondary | ICD-10-CM | POA: Diagnosis not present

## 2021-01-28 NOTE — Assessment & Plan Note (Signed)
Discussed with patient still has the palpable mass noted.  Seems to be bony in nature.  Patient does have an increased risk of osteopenia.  Patient has had thorough work-up and everything has ruled out anything that would be cancerous at the moment.  Likely more of a clinical change based on positioning.  We discussed the possibility of a CT scan which patient declined because she does not feel like it would make any changes.  Patient wants to continue to monitor and we will check again in 2 months.

## 2021-01-28 NOTE — Assessment & Plan Note (Signed)
Patient still has some discomfort on the lateral aspect of the hip.  Discussed hip abductor strengthening and using more of the band that I think will be beneficial.  We discussed continuing to be active.  Discussed proper shoe wear that could also be helpful as well.  Follow-up with me again in 6 weeks.

## 2021-01-28 NOTE — Patient Instructions (Signed)
Spenco Total Support Thin HOKA or OOFOS recover sandals in house See me in 2 months

## 2021-01-28 NOTE — Progress Notes (Signed)
[place] Phone: [phone] Subjective:   Christina Wallace, am serving as a scribe for Dr. Hulan Saas.  This visit occurred during the SARS-CoV-2 public health emergency.  Safety protocols were in place, including screening questions prior to the visit, additional usage of staff PPE, and extensive cleaning of exam room while observing appropriate contact time as indicated for disinfecting solutions.    I'm seeing this patient by the request  of:  Pcp, Wallace  CC: Right hip pain follow-up  [id number]  12/16/2020 Mild abnormality noted.  Could be secondary to potential rotation and could be a candidate for possible osteopathic manipulation but we will get laboratory work-up first.  Given exercises to further evaluate patient's thoracic spine.  Patient has had mammograms and all have been unremarkable.  Patient is doing self breast exams as well and has not noticed any true masses.  Wallace fevers chills or any abnormal weight loss.  Wallace pain that seems to be worse with activity or radiation down the arm.  We will continue to monitor closely and patient will follow up again in 6 weeks  Patient is having right hip pain that seems to be consistent with more of a greater trochanteric bursitis.  Encourage patient to continue the gabapentin but we discussed the possibility as well of topical anti-inflammatories, icing regimen.  Patient declined having the injection today.  Patient will have x-rays done.  Depending on how patient responds we will consider the possibility of formal physical therapy versus possible injections at follow-up.  Update 01/28/2021 Christina Wallace is a 60 y.o. female coming in with complaint of chest pain, L hand and R hip pain. Patient states that she continues to have palpable knot over L pec. Only painful with pressure on that area.  Patient states that nothing new has been occurring.  Wrist and thumb on L side  are the same as last visit. Trying to do some exercises and had to back off with using putty.   Pain in hip is less at night. Is using tart cherry extract.  Patient recently was to have a MRI of the breast done.  Wallace significant evidence for breast malignancy noted. Patient at last exam also had a x-ray of the sternum done I was also independently visualized by me showing Wallace focal lesions noted.    Past Medical History:  Diagnosis Date   BCC (basal cell carcinoma), face    forehead, stomach, legs, shoulders, arm   HSV-1 infection    Past Surgical History:  Procedure Laterality Date   CESAREAN SECTION  1/91   ENDOMETRIAL ABLATION  4/08   MOHS SURGERY  09/2011   BCC on forehead, (2021)under left eye & left ear   TONSILLECTOMY AND ADENOIDECTOMY     WISDOM TOOTH EXTRACTION     Social History   Socioeconomic History   Marital status: Married    Spouse name: Not on file   Number of children: Not on file   Years of education: Not on file   Highest education level: Not on file  Occupational History   Not on file  Tobacco Use   Smoking status: Never   Smokeless tobacco: Never  Vaping Use   Vaping Use: Never used  Substance and Sexual Activity   Alcohol use: Wallace   Drug use: Wallace   Sexual activity: Yes    Partners: Male    Birth control/protection: Surgical    Comment: husband  vasectomy  Other Topics Concern   Not on file  Social History Narrative   Not on file   Social Determinants of Health   Financial Resource Strain: Not on file  Food Insecurity: Not on file  Transportation Needs: Not on file  Physical Activity: Not on file  Stress: Not on file  Social Connections: Not on file   Allergies  Allergen Reactions   Codeine Nausea And Vomiting   Sulfa Antibiotics Rash   Family History  Problem Relation Age of Onset   Breast cancer Mother 43   Diabetes Mother        type II, AODM   Colon polyps Father    Thyroid disease Father    Hypertension Father    Stroke  Father     Current Outpatient Medications (Endocrine & Metabolic):    levothyroxine (SYNTHROID) 50 MCG tablet, Take 1 tablet (50 mcg total) by mouth daily before breakfast.   Current Outpatient Medications (Respiratory):    fluticasone (FLONASE) 50 MCG/ACT nasal spray, Place into both nostrils as needed.     Current Outpatient Medications (Other):    Cholecalciferol (VITAMIN D PO), Take 2,000 Int'l Units by mouth daily.    gabapentin (NEURONTIN) 300 MG capsule, Take 1 capsule (300 mg total) by mouth at bedtime.   Multiple Vitamin (MULTIVITAMIN PO), Take by mouth.   niacinamide 500 MG tablet,    Reviewed prior external information including notes and imaging from  primary care provider As well as notes that were available from care everywhere and other healthcare systems.  Past medical history, social, surgical and family history all reviewed in electronic medical record.  Wallace pertanent information unless stated regarding to the chief complaint.   Review of Systems:  Wallace headache, visual changes, nausea, vomiting, diarrhea, constipation, dizziness, abdominal pain, skin rash, fevers, chills, night sweats, weight loss, swollen lymph nodes, body aches, joint swelling, chest pain, shortness of breath, mood changes. POSITIVE muscle aches  Objective  Blood pressure 100/68, pulse 90, height '5\' 3"'$  (1.6 m), weight 112 lb (50.8 kg), last menstrual period 08/26/2013, SpO2 98 %.   General: Wallace apparent distress alert and oriented x3 mood and affect normal, dressed appropriately.  HEENT: Pupils equal, extraocular movements intact  Respiratory: Patient's speak in full sentences and does not appear short of breath  Cardiovascular: Wallace lower extremity edema, non tender, Wallace erythema  Gait normal with good balance and coordination.  MSK:   Still some tenderness noted on the right side of the hip.  Good range of motion.  Wallace significant decrease in range of motion.  5 out of 5 strength of the lower  extremities.  Patient still has the chest mass noted that does feel to be over the rib.  Approximately 2 fingerbreadths from the sternal border.  Wallace significant enlargement in size noted.  Wallace skin breakdown or any erythema in the area.  Foot exam does show the patient does have breakdown of the transverse arch noted.  Bilaterally.  Good range of motion of the ankle noted.  Very mild narrow heel noted. Impression and Recommendations:     The above documentation has been reviewed and is accurate and complete Christina Pulley, DO

## 2021-01-28 NOTE — Assessment & Plan Note (Signed)
Loss of transverse arch bilaterally.  Could be contributing to some malalignment.  Discussed over-the-counter orthotics, recovery sandals.  Worsening pain could consider custom orthotics but likely not necessary

## 2021-02-19 ENCOUNTER — Other Ambulatory Visit (INDEPENDENT_AMBULATORY_CARE_PROVIDER_SITE_OTHER): Payer: 59

## 2021-02-19 ENCOUNTER — Other Ambulatory Visit: Payer: Self-pay

## 2021-02-19 DIAGNOSIS — E038 Other specified hypothyroidism: Secondary | ICD-10-CM

## 2021-02-19 DIAGNOSIS — E063 Autoimmune thyroiditis: Secondary | ICD-10-CM | POA: Diagnosis not present

## 2021-02-19 LAB — T4, FREE: Free T4: 0.83 ng/dL (ref 0.60–1.60)

## 2021-02-19 LAB — TSH: TSH: 3.13 u[IU]/mL (ref 0.35–5.50)

## 2021-02-23 ENCOUNTER — Other Ambulatory Visit: Payer: Self-pay | Admitting: Internal Medicine

## 2021-03-26 NOTE — Progress Notes (Signed)
[place] Phone: [phone] Subjective:   Christina Wallace, am serving as a scribe for Dr. Hulan Saas. This visit occurred during the SARS-CoV-2 public health emergency.  Safety protocols were in place, including screening questions prior to the visit, additional usage of staff PPE, and extensive cleaning of exam room while observing appropriate contact time as indicated for disinfecting solutions.   I'm seeing this patient by the request  of:  Pcp, Wallace  CC: Hip pain follow-up  [id number]  01/28/2021 Patient still has some discomfort on the lateral aspect of the hip.  Discussed hip abductor strengthening and using more of the band that I think will be beneficial.  We discussed continuing to be active.  Discussed proper shoe wear that could also be helpful as well.  Follow-up with me again in 6 weeks.  Discussed with patient still has the palpable mass noted.  Seems to be bony in nature.  Patient does have an increased risk of osteopenia.  Patient has had thorough work-up and everything has ruled out anything that would be cancerous at the moment.  Likely more of a clinical change based on positioning.  We discussed the possibility of a CT scan which patient declined because she does not feel like it would make any changes.  Patient wants to continue to monitor and we will check again in 2 months.  Update 03/31/2021 Christina Wallace is a 60 y.o. female coming in with complaint of R hip pain. Patient states that she continues to make progress but her pain is not completely gone. Patient has been icing for pain relief.  Patient is wanting to have significant amount of decrease in pain and does feel that injection could be helpful.  Spenco Total Support Orthotics are hurting her R great toe. Believes she may have injured toe a long time ago. Does like OOFOS slides.       Past Medical History:  Diagnosis Date   BCC (basal  cell carcinoma), face    forehead, stomach, legs, shoulders, arm   HSV-1 infection    Past Surgical History:  Procedure Laterality Date   CESAREAN SECTION  1/91   ENDOMETRIAL ABLATION  4/08   MOHS SURGERY  09/2011   BCC on forehead, (2021)under left eye & left ear   TONSILLECTOMY AND ADENOIDECTOMY     WISDOM TOOTH EXTRACTION     Social History   Socioeconomic History   Marital status: Married    Spouse name: Not on file   Number of children: Not on file   Years of education: Not on file   Highest education level: Not on file  Occupational History   Not on file  Tobacco Use   Smoking status: Never   Smokeless tobacco: Never  Vaping Use   Vaping Use: Never used  Substance and Sexual Activity   Alcohol use: Wallace   Drug use: Wallace   Sexual activity: Yes    Partners: Male    Birth control/protection: Surgical    Comment: husband vasectomy  Other Topics Concern   Not on file  Social History Narrative   Not on file   Social Determinants of Health   Financial Resource Strain: Not on file  Food Insecurity: Not on file  Transportation Needs: Not on file  Physical Activity: Not on file  Stress: Not on file  Social Connections: Not on file   Allergies  Allergen Reactions   Codeine Nausea And  Vomiting   Sulfa Antibiotics Rash   Family History  Problem Relation Age of Onset   Breast cancer Mother 20   Diabetes Mother        type II, AODM   Colon polyps Father    Thyroid disease Father    Hypertension Father    Stroke Father     Current Outpatient Medications (Endocrine & Metabolic):    levothyroxine (SYNTHROID) 50 MCG tablet, TAKE 1 TABLET BY MOUTH DAILY BEFORE BREAKFAST   Current Outpatient Medications (Respiratory):    fluticasone (FLONASE) 50 MCG/ACT nasal spray, Place into both nostrils as needed.     Current Outpatient Medications (Other):    Cholecalciferol (VITAMIN D PO), Take 2,000 Int'l Units by mouth daily.    gabapentin (NEURONTIN) 300 MG capsule,  Take 1 capsule (300 mg total) by mouth at bedtime.   Multiple Vitamin (MULTIVITAMIN PO), Take by mouth.   niacinamide 500 MG tablet,    Reviewed prior external information including notes and imaging from  primary care provider As well as notes that were available from care everywhere and other healthcare systems.  Past medical history, social, surgical and family history all reviewed in electronic medical record.  Wallace pertanent information unless stated regarding to the chief complaint.   Review of Systems:  Wallace headache, visual changes, nausea, vomiting, diarrhea, constipation, dizziness, abdominal pain, skin rash, fevers, chills, night sweats, weight loss, swollen lymph nodes, body aches, joint swelling, chest pain, shortness of breath, mood changes. POSITIVE muscle aches  Objective  Blood pressure 110/78, pulse 91, height 5\' 3"  (1.6 m), weight 120 lb (54.4 kg), last menstrual period 08/26/2013, SpO2 97 %.   General: Wallace apparent distress alert and oriented x3 mood and affect normal, dressed appropriately.  HEENT: Pupils equal, extraocular movements intact  Respiratory: Patient's speak in full sentences and does not appear short of breath  Cardiovascular: Wallace lower extremity edema, non tender, Wallace erythema  Gait normal with good balance and coordination.  MSK: Right hip exam shows the patient is less tender than previous exam but still tender the patient does withdraw from palpation.  Positive Faber on the right side.   Procedure: Real-time Ultrasound Guided Injection of right greater trochanteric bursitis secondary to patient's body habitus Device: GE Logiq Q7 Ultrasound guided injection is preferred based studies that show increased duration, increased effect, greater accuracy, decreased procedural pain, increased response rate, and decreased cost with ultrasound guided versus blind injection.  Verbal informed consent obtained.  Time-out conducted.  Noted Wallace overlying erythema,  induration, or other signs of local infection.  Skin prepped in a sterile fashion.  Local anesthesia: Topical Ethyl chloride.  With sterile technique and under real time ultrasound guidance:  Greater trochanteric area was visualized and patient's bursa was noted. A 22-gauge 3 inch needle was inserted and 4 cc of 0.5% Marcaine and 1 cc of Kenalog 40 mg/dL was injected. Pictures taken Completed without difficulty  Pain immediately resolved suggesting accurate placement of the medication.  Advised to call if fevers/chills, erythema, induration, drainage, or persistent bleeding.  Impression: Technically successful ultrasound guided injection.    Impression and Recommendations:     The above documentation has been reviewed and is accurate and complete [name], DO

## 2021-03-31 ENCOUNTER — Ambulatory Visit (INDEPENDENT_AMBULATORY_CARE_PROVIDER_SITE_OTHER): Payer: 59 | Admitting: Family Medicine

## 2021-03-31 ENCOUNTER — Other Ambulatory Visit: Payer: Self-pay

## 2021-03-31 ENCOUNTER — Ambulatory Visit: Payer: Self-pay

## 2021-03-31 ENCOUNTER — Encounter: Payer: Self-pay | Admitting: Family Medicine

## 2021-03-31 VITALS — BP 110/78 | HR 91 | Ht 63.0 in | Wt 120.0 lb

## 2021-03-31 DIAGNOSIS — M25551 Pain in right hip: Secondary | ICD-10-CM

## 2021-03-31 NOTE — Patient Instructions (Addendum)
Keep dong everything else Focus on strength not stretching See you again in 2 months

## 2021-03-31 NOTE — Assessment & Plan Note (Signed)
Patient given injection again today.  Tolerated the procedure well.  Discussed icing regimen.  Discussed potentially formal physical therapy that I think will also be beneficial.  Discussed potentially changing the gabapentin.  Follow-up with me again in 6 to 8 weeks.  Continuing to have pain do think that further imaging may be warranted.

## 2021-06-01 NOTE — Progress Notes (Signed)
[place] Phone: [phone] Subjective:   [name] No, am serving as a scribe for Dr. Hulan Saas.  This visit occurred during the SARS-CoV-2 public health emergency.  Safety protocols were in place, including screening questions prior to the visit, additional usage of staff PPE, and extensive cleaning of exam room while observing appropriate contact time as indicated for disinfecting solutions.    I'm seeing this patient by the request  of:  Pcp, No  CC: Foot and hip pain  [id number]  03/31/2021 Patient given injection again today.  Tolerated the procedure well.  Discussed icing regimen.  Discussed potentially formal physical therapy that I think will also be beneficial.  Discussed potentially changing the gabapentin.  Follow-up with me again in 6 to 8 weeks.  Continuing to have pain do think that further imaging may be warranted.  Update 06/02/2021 Christina Wallace is a 60 y.o. female coming in with complaint of R hip pain. Patient continues to have pain deep in R hip joint. Patient notes pain with lying supine and adducting hip. Does feel like she is able to manage her pain though as it is not as bad as it was when she first came here.      Past Medical History:  Diagnosis Date   BCC (basal cell carcinoma), face    forehead, stomach, legs, shoulders, arm   HSV-1 infection    Past Surgical History:  Procedure Laterality Date   CESAREAN SECTION  1/91   ENDOMETRIAL ABLATION  4/08   MOHS SURGERY  09/2011   BCC on forehead, (2021)under left eye & left ear   TONSILLECTOMY AND ADENOIDECTOMY     WISDOM TOOTH EXTRACTION     Social History   Socioeconomic History   Marital status: Married    Spouse name: Not on file   Number of children: Not on file   Years of education: Not on file   Highest education level: Not on file  Occupational History   Not on file  Tobacco Use   Smoking status: Never    Smokeless tobacco: Never  Vaping Use   Vaping Use: Never used  Substance and Sexual Activity   Alcohol use: No   Drug use: No   Sexual activity: Yes    Partners: Male    Birth control/protection: Surgical    Comment: husband vasectomy  Other Topics Concern   Not on file  Social History Narrative   Not on file   Social Determinants of Health   Financial Resource Strain: Not on file  Food Insecurity: Not on file  Transportation Needs: Not on file  Physical Activity: Not on file  Stress: Not on file  Social Connections: Not on file   Allergies  Allergen Reactions   Codeine Nausea And Vomiting   Sulfa Antibiotics Rash   Family History  Problem Relation Age of Onset   Breast cancer Mother 63   Diabetes Mother        type II, AODM   Colon polyps Father    Thyroid disease Father    Hypertension Father    Stroke Father     Current Outpatient Medications (Endocrine & Metabolic):    levothyroxine (SYNTHROID) 50 MCG tablet, TAKE 1 TABLET BY MOUTH DAILY BEFORE BREAKFAST   Current Outpatient Medications (Respiratory):    fluticasone (FLONASE) 50 MCG/ACT nasal spray, Place into both nostrils as needed.     Current Outpatient Medications (Other):  Cholecalciferol (VITAMIN D PO), Take 2,000 Int'l Units by mouth daily.    gabapentin (NEURONTIN) 300 MG capsule, Take 1 capsule (300 mg total) by mouth at bedtime.   Multiple Vitamin (MULTIVITAMIN PO), Take by mouth.   niacinamide 500 MG tablet,    Reviewed prior external information including notes and imaging from  primary care provider As well as notes that were available from care everywhere and other healthcare systems.  Past medical history, social, surgical and family history all reviewed in electronic medical record.  No pertanent information unless stated regarding to the chief complaint.   Review of Systems:  No headache, visual changes, nausea, vomiting, diarrhea, constipation, dizziness, abdominal pain, skin  rash, fevers, chills, night sweats, weight loss, swollen lymph nodes, body aches, joint swelling, chest pain, shortness of breath, mood changes. POSITIVE muscle aches  Objective  Blood pressure 104/74, pulse 89, height 5\' 3"  (1.6 m), weight 119 lb (54 kg), last menstrual period 08/26/2013, SpO2 98 %.   General: No apparent distress alert and oriented x3 mood and affect normal, dressed appropriately.  HEENT: Pupils equal, extraocular movements intact  Respiratory: Patient's speak in full sentences and does not appear short of breath  Cardiovascular: No lower extremity edema, non tender, no erythema  Gait normal with good balance and coordination.  MSK:  Right hip exam does show some very mild impingement noted.  Pain now seems to be more on the lateral aspect of the hip and just anterior to the midline.Patient has great range of motion of the hip with no significant difficulty.  Patient does have good strength. Foot exam does show a narrow foot noted.  I did not take shoes off.  Patient does have mild pucker of the forefoot of the shoe.   Impression and Recommendations:    The above documentation has been reviewed and is accurate and complete [name], DO

## 2021-06-02 ENCOUNTER — Ambulatory Visit (INDEPENDENT_AMBULATORY_CARE_PROVIDER_SITE_OTHER): Payer: 59 | Admitting: Family Medicine

## 2021-06-02 ENCOUNTER — Other Ambulatory Visit: Payer: Self-pay

## 2021-06-02 ENCOUNTER — Ambulatory Visit: Payer: Self-pay

## 2021-06-02 ENCOUNTER — Encounter: Payer: Self-pay | Admitting: Family Medicine

## 2021-06-02 VITALS — BP 104/74 | HR 89 | Ht 63.0 in | Wt 119.0 lb

## 2021-06-02 DIAGNOSIS — M25551 Pain in right hip: Secondary | ICD-10-CM | POA: Diagnosis not present

## 2021-06-02 DIAGNOSIS — M216X9 Other acquired deformities of unspecified foot: Secondary | ICD-10-CM

## 2021-06-02 NOTE — Assessment & Plan Note (Signed)
Discussed with patient about over-the-counter orthotics which did not seem to help as well as different shoe preferences.  We discussed with patient about different things she should not try to avoid and patient does have a narrow foot as well.  We will continue to monitor.  Patient knows we are here if she wants anything else.  Otherwise could consider custom orthotics if needed.

## 2021-06-02 NOTE — Patient Instructions (Signed)
I think you will do well Foot exercises Stop Tart Cherry Extract  [place] shoes  Don't tie last lace on other shoes Collagen protein [employer] Im here if you have questions Happy Holidays

## 2021-06-02 NOTE — Assessment & Plan Note (Signed)
Patient continues to have the discomfort intermittently.  Patient does have mild arthritic changes and could be potentially having some mild impingement of the hip.  Patient now states that if she had to live this way she would be able to with no significant difficulties.  Discussed with patient to continue to work on the hip strengthening exercises the patient has worked with the physical therapist.  No other changes drastically.  Patient feels as long as she is feeling this way she will follow-up with [place] as needed.

## 2021-06-03 ENCOUNTER — Encounter: Payer: Self-pay | Admitting: Family Medicine

## 2021-08-05 ENCOUNTER — Other Ambulatory Visit: Payer: Self-pay | Admitting: Obstetrics & Gynecology

## 2021-08-05 DIAGNOSIS — Z1231 Encounter for screening mammogram for malignant neoplasm of breast: Secondary | ICD-10-CM

## 2021-08-27 ENCOUNTER — Ambulatory Visit: Payer: 59 | Admitting: Internal Medicine

## 2021-08-27 ENCOUNTER — Encounter: Payer: Self-pay | Admitting: Internal Medicine

## 2021-08-27 ENCOUNTER — Other Ambulatory Visit: Payer: Self-pay

## 2021-08-27 VITALS — BP 100/62 | HR 90 | Ht 63.0 in | Wt 119.8 lb

## 2021-08-27 DIAGNOSIS — Z833 Family history of diabetes mellitus: Secondary | ICD-10-CM | POA: Diagnosis not present

## 2021-08-27 DIAGNOSIS — R7309 Other abnormal glucose: Secondary | ICD-10-CM

## 2021-08-27 DIAGNOSIS — E038 Other specified hypothyroidism: Secondary | ICD-10-CM

## 2021-08-27 DIAGNOSIS — E063 Autoimmune thyroiditis: Secondary | ICD-10-CM

## 2021-08-27 LAB — TSH: TSH: 2.5 u[IU]/mL (ref 0.35–5.50)

## 2021-08-27 LAB — HEMOGLOBIN A1C: Hgb A1c MFr Bld: 6 % (ref 4.6–6.5)

## 2021-08-27 LAB — T4, FREE: Free T4: 0.84 ng/dL (ref 0.60–1.60)

## 2021-08-27 NOTE — Progress Notes (Signed)
Patient ID: Christina Wallace, female   DOB: [birth year], 61 y.o.   MRN: [id number]   This visit occurred during the SARS-CoV-2 public health emergency.  Safety protocols were in place, including screening questions prior to the visit, additional usage of staff PPE, and extensive cleaning of exam room while observing appropriate contact time as indicated for disinfecting solutions.   HPI  Christina Wallace is a 61 y.o.-year-old female, initially referred by her OB/GYN provider, Dr. Hale Bogus, now returning for follow-up for hypothyroidism due to Hashimoto's thyroiditis.  Last visit 1 year ago.  Interim history: At last visit, she was more tired that she was keeping her grandchildren.   She still has some fatigue. She had R bursitis >> saw Dr. Tamala Julian >> had a steroid inj - more than 1 mo ago.  Reviewed history: Pt. has been found to have a slightly abnormal TSH in 10/2013.  However, the test normalized afterwards with level in the upper range of normal.  At last check with Dr. Sabra Heck in 07/2018, TSH is increased to 7.18.  A free T4 level was also low 2 days later. She was referred to endocrinology at that time.   In  08/2018, her TSH was close to 10 and I suggested to start levothyroxine.  However, she wanted to have the TFTs repeated and another TSH obtained 3 months later was still high.   In 11/2018 we checked her antithyroid antibodies and they were positive, giving her a diagnosis of Hashimoto's thyroiditis.  She tried selenium supplement, gluten-free diet in an effort to avoid starting levothyroxine, however, with no changes in TFTs.   In 11/2018, she agreed to start levothyroxine, which she continues now at 25 mcg daily.  On this dose, TFTs normalized.  In 01/2020, we increased the levothyroxine dose to 50 mcg daily.  Pt takes levothyroxine: - in am - fasting - 45 min-2h from b'fast - no calcium - no iron - + multivitamins 2h later-very low iron, no calcium, 160 mcg biotin - no  PPIs  Reviewed her TFTs: Lab Results  Component Value Date   TSH 3.13 02/19/2021   TSH 4.23 08/22/2020   TSH 2.25 04/09/2020   TSH 4.66 (H) 02/22/2020   TSH 3.15 08/24/2019   TSH 4.18 04/18/2019   TSH 3.33 02/13/2019   TSH 9.68 (H) 12/18/2018   TSH 9.11 (H) 09/18/2018   TSH 7.180 (H) 08/18/2018   FREET4 0.83 02/19/2021   FREET4 0.84 08/22/2020   FREET4 0.88 04/09/2020   FREET4 0.83 02/22/2020   FREET4 0.76 08/24/2019   FREET4 0.68 04/18/2019   FREET4 0.85 02/13/2019   FREET4 0.77 12/18/2018   FREET4 0.64 09/18/2018   FREET4 0.81 (L) 08/23/2018   T3FREE 3.1 02/13/2019   T3FREE 3.1 12/18/2018   T3FREE 3.9 09/18/2018    Reviewed previous antibody levels: Component     Latest Ref Rng & Units 12/18/2018  Thyroperoxidase Ab SerPl-aCnc     <9 IU/mL 293 (H)   Component     Latest Ref Rng & Units 09/18/2018  Thyroperoxidase Ab SerPl-aCnc     <9 IU/mL 353 (H)  Thyroglobulin Ab     < or = 1 IU/mL <1   Pt denies: - feeling nodules in neck - hoarseness - dysphagia - choking - SOB with lying down  She has + FH of thyroid disorders in: father (thyroid nodule), mother, sister, daughter - during pregnancy.  No family history of thyroid cancer.  No history of radiation therapy to head  or neck. No herbal supplements,  steroids.  Pt. also has a history of basal cell tumors  - on Niacinamide. She is on vitamin D supplements. She has a h/o eosonophilic esophagitis by EGD in ~2008. She continues to exercise consistently, cardio and weights.  ROS: + see HPI  I reviewed pt's medications, allergies, PMH, social hx, family hx, and changes were documented in the history of present illness. Otherwise, unchanged from my initial visit note.  Past Medical History:  Diagnosis Date   BCC (basal cell carcinoma), face    forehead, stomach, legs, shoulders, arm   HSV-1 infection    Past Surgical History:  Procedure Laterality Date   CESAREAN SECTION  1/91   ENDOMETRIAL ABLATION  4/08    MOHS SURGERY  09/2011   BCC on forehead, (2021)under left eye & left ear   TONSILLECTOMY AND ADENOIDECTOMY     WISDOM TOOTH EXTRACTION     Social History   Socioeconomic History   Marital status: Married    Spouse name: Not on file   Number of children: 2   Years of education: Not on file   Highest education level: Not on file  Occupational History   Not on file  Social Needs   Financial resource strain: Not on file   Food insecurity:    Worry: Not on file    Inability: Not on file   Transportation needs:    Medical: Not on file    Non-medical: Not on file  Tobacco Use   Smoking status: Never Smoker   Smokeless tobacco: Never Used  Substance and Sexual Activity   Alcohol use: No    Frequency: Never   Drug use: No   Current Outpatient Medications on File Prior to Visit  Medication Sig Dispense Refill   Cholecalciferol (VITAMIN D PO) Take 2,000 Int'l Units by mouth daily.      fluticasone (FLONASE) 50 MCG/ACT nasal spray Place into both nostrils as needed.      gabapentin (NEURONTIN) 300 MG capsule Take 1 capsule (300 mg total) by mouth at bedtime. 90 capsule 3   levothyroxine (SYNTHROID) 50 MCG tablet TAKE 1 TABLET BY MOUTH DAILY BEFORE BREAKFAST 30 tablet 6   Multiple Vitamin (MULTIVITAMIN PO) Take by mouth.     niacinamide 500 MG tablet      No current facility-administered medications on file prior to visit.   Allergies  Allergen Reactions   Codeine Nausea And Vomiting   Sulfa Antibiotics Rash   Family History  Problem Relation Age of Onset   Breast cancer Mother 62   Diabetes Mother        type II, AODM   Colon polyps Father    Thyroid disease Father    Hypertension Father    Stroke Father   Also, sister with HTN. Please also see HPI.  PE: BP 100/62 (BP Location: Right Arm, Patient Position: Sitting, Cuff Size: Normal)    Pulse 90    Ht 5\' 3"  (1.6 m)    Wt 119 lb 12.8 oz (54.3 kg)    LMP 08/26/2013 Comment: ablation 2008   SpO2 97%    BMI 21.22 kg/m   Wt Readings from Last 3 Encounters:  08/27/21 119 lb 12.8 oz (54.3 kg)  06/02/21 119 lb (54 kg)  03/31/21 120 lb (54.4 kg)   Constitutional: normal weight, in NAD Eyes: PERRLA, EOMI, no exophthalmos ENT: moist mucous membranes, no thyromegaly, no cervical lymphadenopathy Cardiovascular: tachycardia, RR, No MRG Respiratory: CTA B Musculoskeletal: no  deformities, strength intact in all 4 Skin: moist, warm, no rashes Neurological: no tremor with outstretched hands, DTR normal in all 4  ASSESSMENT: 1. Hypothyroidism 2/2 Hashimoto's thyroiditis -She was on selenium in the past but she did not feel a difference, so we stopped it  2. FH of DM  PLAN:  1. Patient with history of hypothyroidism, diagnosed as due to Hashimoto's thyroiditis in 11/2019 her thyroid antibodies returned elevated.  She is on generic levothyroxine. - latest thyroid labs reviewed with pt. >> normal: Lab Results  Component Value Date   TSH 3.13 02/19/2021  - she continues on LT4 50 mcg daily - pt feels good on this dose. - we discussed about taking the thyroid hormone every day, with water, >30 minutes before breakfast, separated by >4 hours from acid reflux medications, calcium, iron, multivitamins. Pt. is taking it correctly.  Her multivitamin are 2 hours after levothyroxine but this contains no calcium and very small amount of iron. - will check thyroid tests today: TSH and fT4 - If labs are abnormal, she will need to return for repeat TFTs in 1.5 months - I will see her back in 1 year  2. FH of DM  - sister with GDM - pt. had an 11 lbs baby 30 years ago - no increased urination, increased thirst, weight loss - will check a HbA1c today - discuss normal, prediabetic, diabetic HbA1c ranges  Needs refills.  Component     Latest Ref Rng & Units 08/27/2021  TSH     0.35 - 5.50 uIU/mL 2.50  T4,Free(Direct)     0.60 - 1.60 ng/dL 0.84  Hemoglobin A1C     4.6 - 6.5 % 6.0  TFTs are normal, however, HbA1c is in  the prediabetic range.  We cannot establish the diagnosis only based on 1 value, will need a repeat.  Will encourage dietary changes and she will need another HbA1c check in 4 to 6 months.  Philemon Kingdom, MD PhD [facility] Endocrinology

## 2021-08-27 NOTE — Patient Instructions (Signed)
Please continue Levothyroxine 50 mcg daily.  Take the thyroid hormone every day, with water, at least 30 minutes before breakfast, separated by at least 4 hours from: - acid reflux medications - calcium - iron - multivitamins  Please stop at the lab.  Please come back for a follow-up appointment in 1 year. 

## 2021-09-12 ENCOUNTER — Other Ambulatory Visit: Payer: Self-pay | Admitting: Internal Medicine

## 2021-09-14 ENCOUNTER — Encounter (HOSPITAL_BASED_OUTPATIENT_CLINIC_OR_DEPARTMENT_OTHER): Payer: Self-pay | Admitting: Obstetrics & Gynecology

## 2021-09-22 ENCOUNTER — Ambulatory Visit
Admission: RE | Admit: 2021-09-22 | Discharge: 2021-09-22 | Disposition: A | Discharge status: Home / Self Care | Payer: 59 | Source: Ambulatory Visit | Attending: Obstetrics & Gynecology | Admitting: Obstetrics & Gynecology

## 2021-09-22 DIAGNOSIS — Z1231 Encounter for screening mammogram for malignant neoplasm of breast: Secondary | ICD-10-CM

## 2021-11-17 ENCOUNTER — Encounter (HOSPITAL_BASED_OUTPATIENT_CLINIC_OR_DEPARTMENT_OTHER): Payer: Self-pay | Admitting: Obstetrics & Gynecology

## 2021-11-17 ENCOUNTER — Ambulatory Visit (INDEPENDENT_AMBULATORY_CARE_PROVIDER_SITE_OTHER): Payer: 59 | Admitting: Obstetrics & Gynecology

## 2021-11-17 VITALS — BP 109/53 | HR 75 | Ht 63.5 in | Wt 116.4 lb

## 2021-11-17 DIAGNOSIS — E2839 Other primary ovarian failure: Secondary | ICD-10-CM

## 2021-11-17 DIAGNOSIS — E063 Autoimmune thyroiditis: Secondary | ICD-10-CM

## 2021-11-17 DIAGNOSIS — Z9189 Other specified personal risk factors, not elsewhere classified: Secondary | ICD-10-CM | POA: Diagnosis not present

## 2021-11-17 DIAGNOSIS — Z01419 Encounter for gynecological examination (general) (routine) without abnormal findings: Secondary | ICD-10-CM

## 2021-11-17 DIAGNOSIS — E559 Vitamin D deficiency, unspecified: Secondary | ICD-10-CM | POA: Diagnosis not present

## 2021-11-17 DIAGNOSIS — Z0000 Encounter for general adult medical examination without abnormal findings: Secondary | ICD-10-CM

## 2021-11-17 NOTE — Progress Notes (Signed)
61 y.o. [id number] Married White or Caucasian female here for annual exam.  Doing well.  Denies vaginal bleeding.  Having some issues with insomnia.  Stopped her gabapentin.  She's having some hot flashes but is tolerating this fine.    Patient's last menstrual period was 08/26/2013.          Sexually active: Yes.    The current method of family planning is post menopausal status.    Smoker:  no  Health Maintenance: Pap:  11/08/2019 Negative History of abnormal Pap:  no MMG:  09/22/2021 Negative Colonoscopy:  11/06/2012 BMD:   plan around age 12 Screening Labs: ordered today   reports that she has never smoked. She has never used smokeless tobacco. She reports that she does not drink alcohol and does not use drugs.  Past Medical History:  Diagnosis Date   BCC (basal cell carcinoma), face    forehead, stomach, legs, shoulders, arm   HSV-1 infection     Past Surgical History:  Procedure Laterality Date   CESAREAN SECTION  1/91   ENDOMETRIAL ABLATION  4/08   MOHS SURGERY  09/2011   BCC on forehead, (2021)under left eye & left ear   TONSILLECTOMY AND ADENOIDECTOMY     WISDOM TOOTH EXTRACTION      Current Outpatient Medications  Medication Sig Dispense Refill   Cholecalciferol (VITAMIN D PO) Take 2,000 Int'l Units by mouth daily.      fluticasone (FLONASE) 50 MCG/ACT nasal spray Place into both nostrils as needed.      levothyroxine (SYNTHROID) 50 MCG tablet TAKE 1 TABLET BY MOUTH EVERY DAY BEFORE BREAKFAST 30 tablet 6   No current facility-administered medications for this visit.    Family History  Problem Relation Age of Onset   Breast cancer Mother 27   Diabetes Mother        type II, AODM   Colon polyps Father    Thyroid disease Father    Hypertension Father    Stroke Father    ROS: Genitourinary:negative  Exam:   BP (!) 109/53 (BP Location: Right Arm, Patient Position: Sitting, Cuff Size: Normal)   Pulse 75   Ht 5' 3.5" (1.613 m) Comment: reported  Wt 116 lb  6.4 oz (52.8 kg)   LMP 08/26/2013 Comment: ablation 2008  BMI 20.30 kg/m   Height: 5' 3.5" (161.3 cm) (reported)  General appearance: alert, cooperative and appears stated age Head: Normocephalic, without obvious abnormality, atraumatic Neck: no adenopathy, supple, symmetrical, trachea midline and thyroid normal to inspection and palpation Lungs: clear to auscultation bilaterally Breasts: normal appearance, no masses or tenderness Heart: regular rate and rhythm Abdomen: soft, non-tender; bowel sounds normal; no masses,  no organomegaly Extremities: extremities normal, atraumatic, no cyanosis or edema Skin: Skin color, texture, turgor normal. No rashes or lesions Lymph nodes: Cervical, supraclavicular, and axillary nodes normal. No abnormal inguinal nodes palpated Neurologic: Grossly normal   Pelvic: External genitalia:  no lesions              Urethra:  normal appearing urethra with no masses, tenderness or lesions              Bartholins and Skenes: normal                 Vagina: normal appearing vagina with normal color and no discharge, no lesions              Cervix: no lesions  Pap taken: No. Bimanual Exam:  Uterus:  normal size, contour, position, consistency, mobility, non-tender              Adnexa: normal adnexa and no mass, fullness, tenderness               Rectovaginal: Confirms               Anus:  normal sphincter tone, no lesions  Chaperone, Octaviano Batty, CMA, was present for exam.  Assessment/Plan: 1. Well woman exam with routine gynecological exam - pap neg with neg HR HPV 2021 - MMG up to date - colonoscopy 2014.  Due next year. - BMD ordered to do with MMG next year.  2. Blood tests for routine general physical examination - CBC - Comprehensive metabolic panel - Hemoglobin A1c - Lipid panel  3. Vitamin D deficiency - VITAMIN D 25 Hydroxy (Vit-D Deficiency, Fractures)  4. Hashimoto's thyroiditis  5. Increased risk of breast cancer -  doing every other year screening MRI  6. Hypoestrogenism - DG BONE DENSITY (DXA); Future

## 2021-11-18 LAB — CBC
Hematocrit: 42 % (ref 34.0–46.6)
Hemoglobin: 14.3 g/dL (ref 11.1–15.9)
MCH: 30.4 pg (ref 26.6–33.0)
MCHC: 34 g/dL (ref 31.5–35.7)
MCV: 89 fL (ref 79–97)
Platelets: 223 10*3/uL (ref 150–450)
RBC: 4.7 x10E6/uL (ref 3.77–5.28)
RDW: 11.9 % (ref 11.7–15.4)
WBC: 4.4 10*3/uL (ref 3.4–10.8)

## 2021-11-18 LAB — COMPREHENSIVE METABOLIC PANEL
ALT: 22 IU/L (ref 0–32)
AST: 22 IU/L (ref 0–40)
Albumin/Globulin Ratio: 2.6 — ABNORMAL HIGH (ref 1.2–2.2)
Albumin: 4.9 g/dL (ref 3.8–4.9)
Alkaline Phosphatase: 74 IU/L (ref 44–121)
BUN/Creatinine Ratio: 28 — ABNORMAL HIGH (ref 9–23)
BUN: 21 mg/dL (ref 6–24)
Bilirubin Total: 0.5 mg/dL (ref 0.0–1.2)
CO2: 26 mmol/L (ref 20–29)
Calcium: 9.4 mg/dL (ref 8.7–10.2)
Chloride: 101 mmol/L (ref 96–106)
Creatinine, Ser: 0.74 mg/dL (ref 0.57–1.00)
Globulin, Total: 1.9 g/dL (ref 1.5–4.5)
Glucose: 96 mg/dL (ref 70–99)
Potassium: 4.7 mmol/L (ref 3.5–5.2)
Sodium: 139 mmol/L (ref 134–144)
Total Protein: 6.8 g/dL (ref 6.0–8.5)
eGFR: 93 mL/min/{1.73_m2} (ref >59)

## 2021-11-18 LAB — LIPID PANEL
Chol/HDL Ratio: 2.8 ratio (ref 0.0–4.4)
Cholesterol, Total: 179 mg/dL (ref 100–199)
HDL: 65 mg/dL (ref >39)
LDL Chol Calc (NIH): 107 mg/dL — ABNORMAL HIGH (ref 0–99)
Triglycerides: 34 mg/dL (ref 0–149)
VLDL Cholesterol Cal: 7 mg/dL (ref 5–40)

## 2021-11-18 LAB — HEMOGLOBIN A1C
Est. average glucose Bld gHb Est-mCnc: 123 mg/dL
Hgb A1c MFr Bld: 5.9 % — ABNORMAL HIGH (ref 4.8–5.6)

## 2021-11-18 LAB — VITAMIN D 25 HYDROXY (VIT D DEFICIENCY, FRACTURES): Vit D, 25-Hydroxy: 67.4 ng/mL (ref 30.0–100.0)

## 2021-12-24 ENCOUNTER — Other Ambulatory Visit: Payer: Self-pay | Admitting: Obstetrics & Gynecology

## 2021-12-24 DIAGNOSIS — Z1231 Encounter for screening mammogram for malignant neoplasm of breast: Secondary | ICD-10-CM

## 2022-02-25 ENCOUNTER — Other Ambulatory Visit: Payer: Self-pay | Admitting: Internal Medicine

## 2022-02-25 DIAGNOSIS — E038 Other specified hypothyroidism: Secondary | ICD-10-CM

## 2022-03-03 ENCOUNTER — Other Ambulatory Visit (INDEPENDENT_AMBULATORY_CARE_PROVIDER_SITE_OTHER): Payer: 59

## 2022-03-03 DIAGNOSIS — R7309 Other abnormal glucose: Secondary | ICD-10-CM | POA: Diagnosis not present

## 2022-03-03 DIAGNOSIS — E038 Other specified hypothyroidism: Secondary | ICD-10-CM | POA: Diagnosis not present

## 2022-03-03 DIAGNOSIS — E063 Autoimmune thyroiditis: Secondary | ICD-10-CM

## 2022-03-03 LAB — HEMOGLOBIN A1C: Hgb A1c MFr Bld: 5.9 % (ref 4.6–6.5)

## 2022-03-03 LAB — TSH: TSH: 2.82 u[IU]/mL (ref 0.35–5.50)

## 2022-03-03 LAB — T4, FREE: Free T4: 0.85 ng/dL (ref 0.60–1.60)

## 2022-03-29 ENCOUNTER — Other Ambulatory Visit: Payer: Self-pay | Admitting: Internal Medicine

## 2022-08-23 IMAGING — MG MM DIGITAL SCREENING BILAT W/ TOMO AND CAD
8 series · 9 of 24 positions shown · non-contrast
Comparison: Previous exam(s).

CLINICAL DATA: Screening.

EXAM:
DIGITAL SCREENING BILATERAL MAMMOGRAM WITH TOMOSYNTHESIS AND CAD
TECHNIQUE: Bilateral screening digital craniocaudal and mediolateral oblique
mammograms were obtained. Bilateral screening digital breast
tomosynthesis was performed. The images were evaluated with
computer-aided detection.

[L MLO synth-2D]
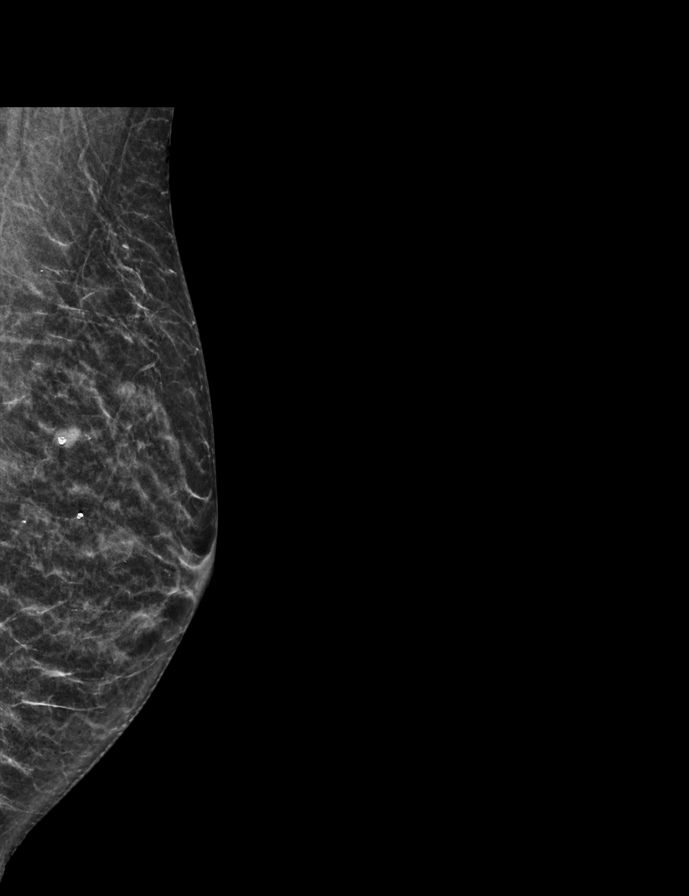

[L CC synth-2D]
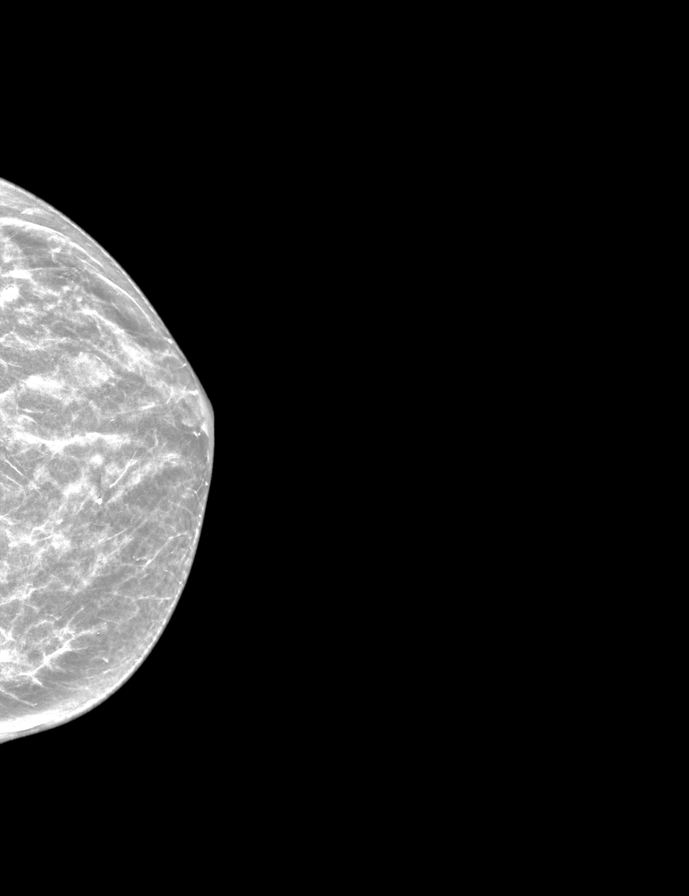

[R CC synth-2D]
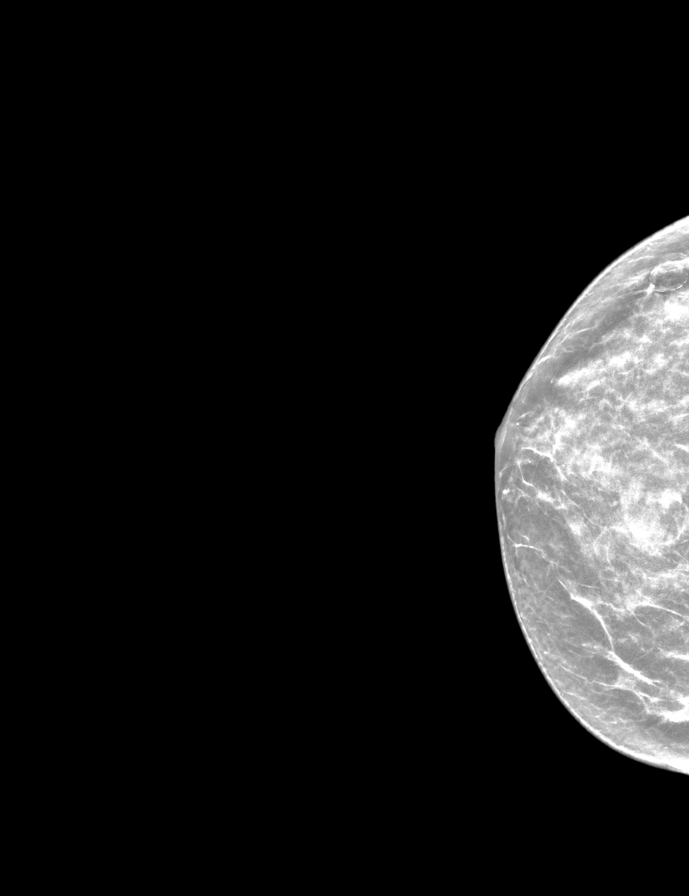

[R MLO synth-2D]
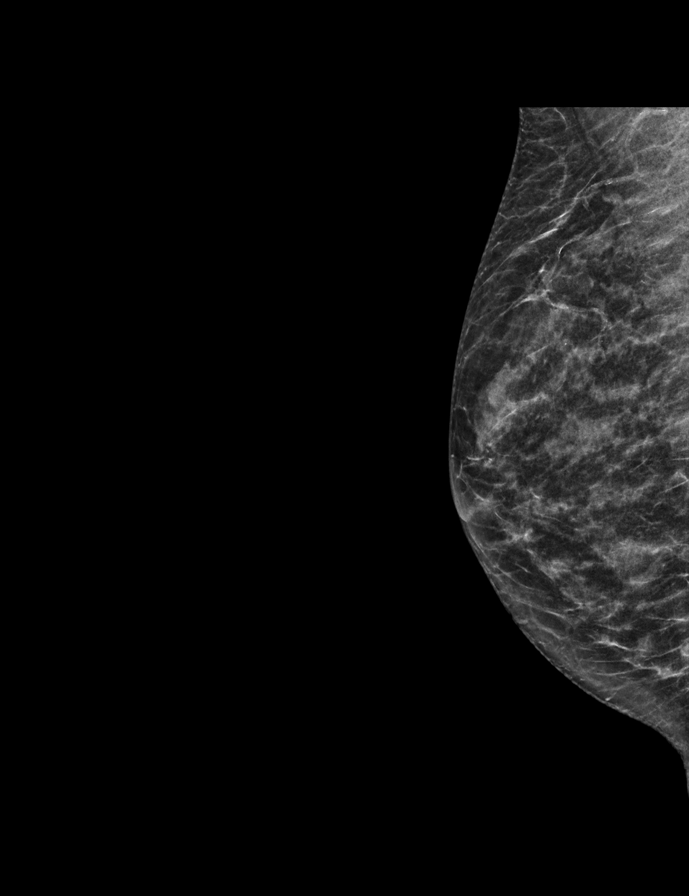

[L CC tomo · 2 of 44 frames shown]
[frame 15/44]
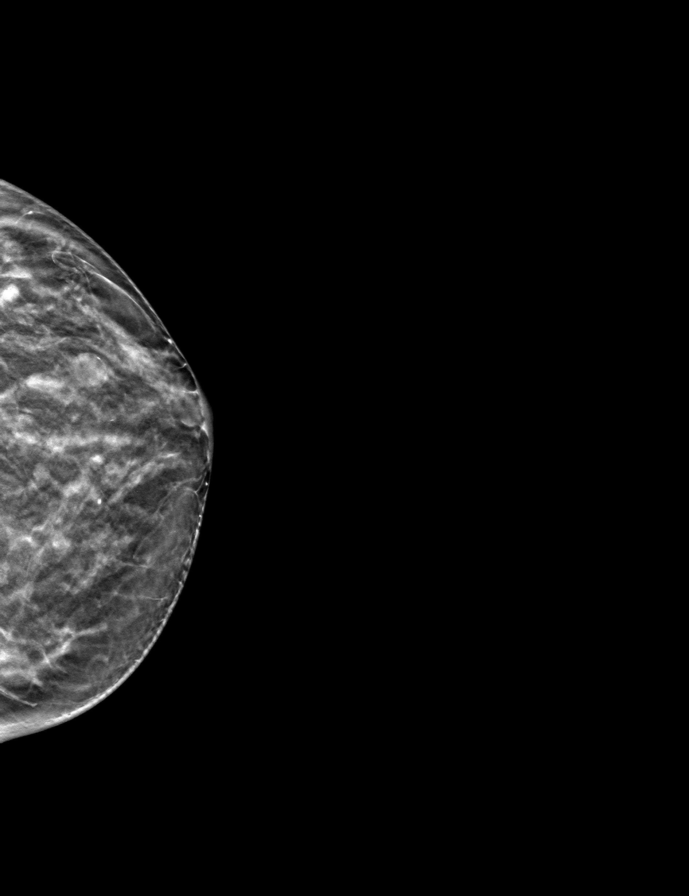
[frame 23/44]
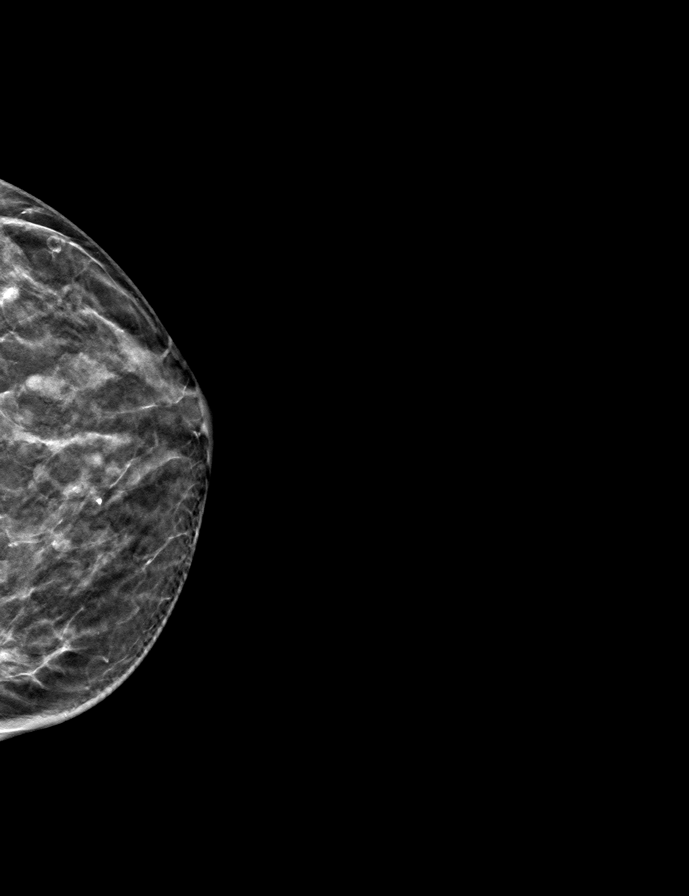

[R CC tomo · tomo slice 24/47.0]
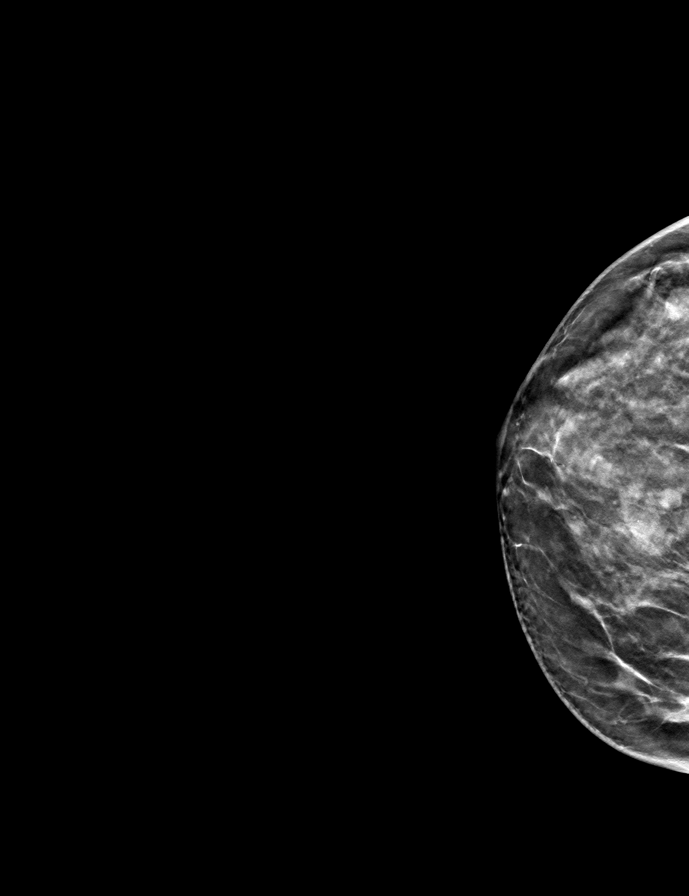

[L MLO tomo · tomo slice 22/43.0]
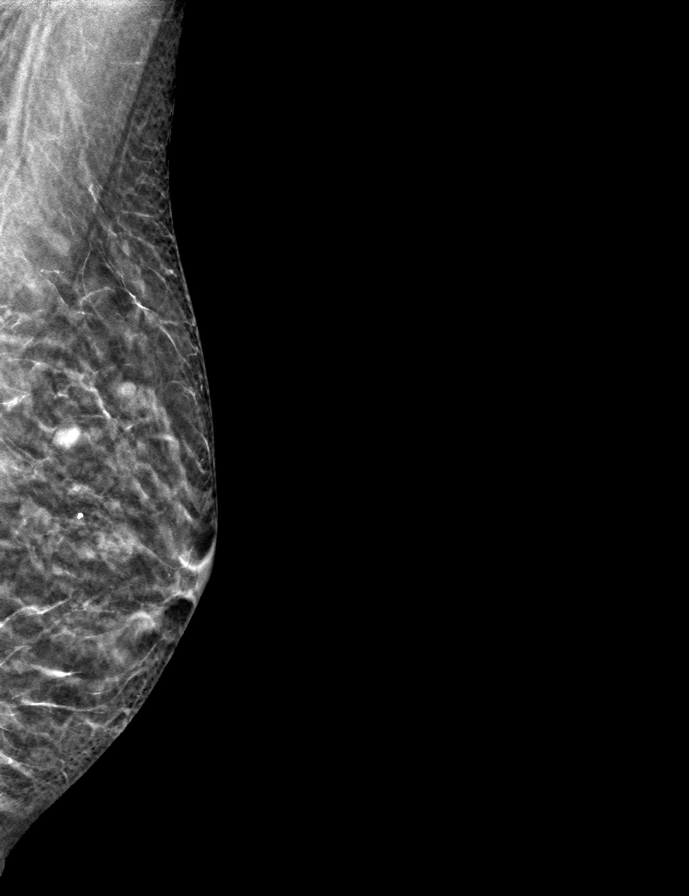

[R MLO tomo · tomo slice 23/46.0]
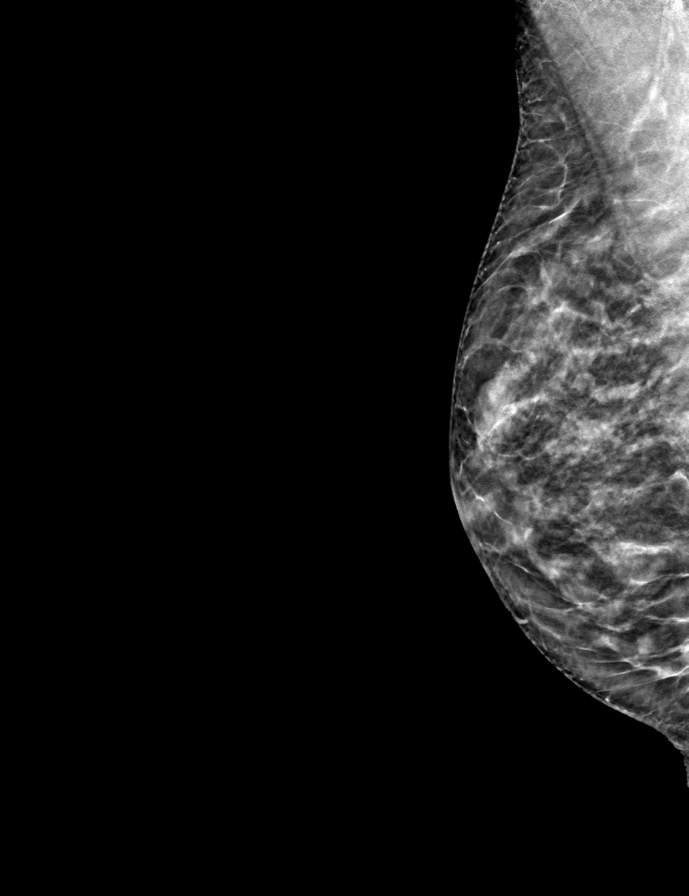

[9 of 24 positions shown; findings below may reference images not displayed]

ACR Breast Density Category c: The breast tissue is heterogeneously
dense, which may obscure small masses.
FINDINGS: There are no findings suspicious for malignancy. The images were
evaluated with computer-aided detection.
IMPRESSION: No mammographic evidence of malignancy. A result letter of this
screening mammogram will be mailed directly to the patient.

RECOMMENDATION:
Screening mammogram in one year. (Code:T4-5-GWO)

BI-RADS CATEGORY  1: Negative.

## 2022-09-02 ENCOUNTER — Encounter: Payer: Self-pay | Admitting: Internal Medicine

## 2022-09-02 ENCOUNTER — Ambulatory Visit: Payer: 59 | Admitting: Internal Medicine

## 2022-09-02 VITALS — BP 110/68 | HR 95 | Ht 63.5 in | Wt 118.0 lb

## 2022-09-02 DIAGNOSIS — E063 Autoimmune thyroiditis: Secondary | ICD-10-CM | POA: Diagnosis not present

## 2022-09-02 DIAGNOSIS — R7309 Other abnormal glucose: Secondary | ICD-10-CM | POA: Diagnosis not present

## 2022-09-02 DIAGNOSIS — Z833 Family history of diabetes mellitus: Secondary | ICD-10-CM

## 2022-09-02 DIAGNOSIS — E038 Other specified hypothyroidism: Secondary | ICD-10-CM

## 2022-09-02 LAB — HEMOGLOBIN A1C: Hgb A1c MFr Bld: 5.9 % (ref 4.6–6.5)

## 2022-09-02 LAB — T4, FREE: Free T4: 0.75 ng/dL (ref 0.60–1.60)

## 2022-09-02 LAB — TSH: TSH: 2.53 u[IU]/mL (ref 0.35–5.50)

## 2022-09-02 MED ORDER — LEVOTHYROXINE SODIUM 50 MCG PO TABS: ORAL_TABLET | ORAL | 11 refills | Status: DC

## 2022-09-02 NOTE — Progress Notes (Signed)
Patient ID: Christina Wallace, female   DOB: 05/18/1961, 62 y.o.   MRN: [id number]   HPI  Christina Wallace is a 62 y.o.-year-old femalear-old female, initially referred by her OB/GYN provider, Dr. Hale Bogus, now returning for follow-up for hypothyroidism due to Hashimoto's thyroiditis.  Last visit 1 year ago.  Interim history: She had R bursitis >> saw Dr. Tamala Julian >> had a steroid injection before last visit.  No injections since then. She continues to have some fatigue which she attributes to not sleeping well. She also has some hot flashes, but not very bothersome.  Reviewed history: Pt. has been found to have a slightly abnormal TSH in 10/2013.  However, the test normalized afterwards with level in the upper range of normal.  At last check with Dr. Sabra Heck in 07/2018, TSH is increased to 7.18.  A free T4 level was also low 2 days later. She was referred to endocrinology at that time.   In  08/2018, her TSH was close to 10 and I suggested to start levothyroxine.  However, she wanted to have the TFTs repeated and another TSH obtained 3 months later was still high.   In 11/2018 we checked her antithyroid antibodies and they were positive, giving her a diagnosis of Hashimoto's thyroiditis.  She tried selenium supplement, gluten-free diet in an effort to avoid starting levothyroxine, however, with no changes in TFTs.   In 11/2018, she agreed to start levothyroxine, which she continues now at 25 mcg daily.  On this dose, TFTs normalized.  In 01/2020, we increased the levothyroxine dose to 50 mcg daily.  Pt takes levothyroxine: - in am - fasting - 34 min-2h from b'fast - no calcium - no iron - stopped multivitamins (very low iron, no calcium, 160 mcg biotin) - no PPIs  Reviewed her TFTs: Lab Results  Component Value Date   TSH 2.82 03/03/2022   TSH 2.50 08/27/2021   TSH 3.13 02/19/2021   TSH 4.23 08/22/2020   TSH 2.25 04/09/2020   TSH 4.66 (H) 02/22/2020   TSH 3.15 08/24/2019   TSH 4.18 04/18/2019    TSH 3.33 02/13/2019   TSH 9.68 (H) 12/18/2018   FREET4 0.85 03/03/2022   FREET4 0.84 08/27/2021   FREET4 0.83 02/19/2021   FREET4 0.84 08/22/2020   FREET4 0.88 04/09/2020   FREET4 0.83 02/22/2020   FREET4 0.76 08/24/2019   FREET4 0.68 04/18/2019   FREET4 0.85 02/13/2019   FREET4 0.77 12/18/2018   T3FREE 3.1 02/13/2019   T3FREE 3.1 12/18/2018   T3FREE 3.9 09/18/2018    Reviewed previous antibody levels: Component     Latest Ref Rng & Units 12/18/2018  Thyroperoxidase Ab SerPl-aCnc     <9 IU/mL 293 (H)   Component     Latest Ref Rng & Units 09/18/2018  Thyroperoxidase Ab SerPl-aCnc     <9 IU/mL 353 (H)  Thyroglobulin Ab     < or = 1 IU/mL <1   Pt denies: - feeling nodules in neck - hoarseness - choking She has a history of eosinophilic esophagitis diagnosed by EGD in 2008.  She has occasional dysphagia.  She has + FH of thyroid disorders in: father (thyroid nodule), mother, sister, daughter - during pregnancy.  No family history of thyroid cancer.  No history of radiation therapy to head or neck. No herbal supplements, steroids.  Pt. also has a history of basal cell tumors  - on Niacinamide. She is on vitamin D supplements. She continues to exercise consistently, cardio and weights.  ROS: + see HPI  I reviewed pt's medications, allergies, PMH, social hx, family hx, and changes were documented in the history of present illness. Otherwise, unchanged from my initial visit note.  Past Medical History:  Diagnosis Date   BCC (basal cell carcinoma), face    forehead, stomach, legs, shoulders, arm   HSV-1 infection    Past Surgical History:  Procedure Laterality Date   CESAREAN SECTION  1/91   ENDOMETRIAL ABLATION  4/08   MOHS SURGERY  09/2011   BCC on forehead, (2021)under left eye & left ear   TONSILLECTOMY AND ADENOIDECTOMY     WISDOM TOOTH EXTRACTION     Social History   Socioeconomic History   Marital status: Married    Spouse name: Not on file   Number  of children: 2   Years of education: Not on file   Highest education level: Not on file  Occupational History   Not on file  Social Needs   Financial resource strain: Not on file   Food insecurity:    Worry: Not on file    Inability: Not on file   Transportation needs:    Medical: Not on file    Non-medical: Not on file  Tobacco Use   Smoking status: Never Smoker   Smokeless tobacco: Never Used  Substance and Sexual Activity   Alcohol use: No    Frequency: Never   Drug use: No   Current Outpatient Medications on File Prior to Visit  Medication Sig Dispense Refill   Cholecalciferol (VITAMIN D PO) Take 2,000 Int'l Units by mouth daily.      fluticasone (FLONASE) 50 MCG/ACT nasal spray Place into both nostrils as needed.      levothyroxine (SYNTHROID) 50 MCG tablet TAKE 1 TABLET BY MOUTH EVERY DAY BEFORE BREAKFAST 30 tablet 6   No current facility-administered medications on file prior to visit.   Allergies  Allergen Reactions   Codeine Nausea And Vomiting   Sulfa Antibiotics Rash   Family History  Problem Relation Age of Onset   Breast cancer Mother 56   Diabetes Mother        type II, AODM   Colon polyps Father    Thyroid disease Father    Hypertension Father    Stroke Father   Also, sister with HTN. Please also see HPI.  PE: BP 110/68 (BP Location: Left Arm, Patient Position: Sitting, Cuff Size: Normal)   Pulse 95   Ht 5' 3.5" (1.613 m)   Wt 118 lb (53.5 kg)   LMP 08/26/2013 Comment: ablation 2008  SpO2 99%   BMI 20.57 kg/m  Wt Readings from Last 3 Encounters:  09/02/22 118 lb (53.5 kg)  11/17/21 116 lb 6.4 oz (52.8 kg)  08/27/21 119 lb 12.8 oz (54.3 kg)   Constitutional: normal weight, in NAD Eyes:  EOMI, no exophthalmos ENT: no neck masses, no cervical lymphadenopathy Cardiovascular: tachycardia, RR, No MRG Respiratory: CTA B Musculoskeletal: no deformities Skin:no rashes Neurological: no tremor with outstretched hands  ASSESSMENT: 1.  Hypothyroidism 2/2 Hashimoto's thyroiditis -She was on selenium in the past but she did not feel a difference, so we stopped it  2. Elevated HbA1c - FH of DM  PLAN:  1. Patient with history of hypothyroidism, diagnosed as due to Hashimoto's thyroiditis in 11/2019 her thyroid antibodies returned elevated.  She is on generic levothyroxine. - latest thyroid labs reviewed with pt. >> normal: Lab Results  Component Value Date   TSH 2.82 03/03/2022  - she  continues on LT4 50 mcg daily - pt feels good on this dose. - we discussed about taking the thyroid hormone every day, with water, >30 minutes before breakfast, separated by >4 hours from acid reflux medications, calcium, iron, multivitamins. Pt. is taking it correctly.  Off MVI now. - will check thyroid tests today: TSH and fT4 - If labs are abnormal, she will need to return for repeat TFTs in 1.5 months  2. Elevated HbA1c - FH of DM  - No increased urination, blurry vision - Her sister has GDM - pt. had an 11 lbs baby 30 years ago -Latest HbA1c was 5.9%, stable from the previous check but decreased from 6.0% at our last visit -Will recheck this today -We again reviewed normal, prediabetic, and diabetic HbA1c ranges  Needs refills - 30 tablets at the time as she prefers to obtain them from the local pharmacy rather than mail order.  Component     Latest Ref Rng 09/02/2022  TSH     0.35 - 5.50 uIU/mL 2.53   T4,Free(Direct)     0.60 - 1.60 ng/dL 0.75   Hemoglobin A1C     4.6 - 6.5 % 5.9   HbA1c stable.  TFTs are normal.  Philemon Kingdom, MD PhD [facility] Endocrinology

## 2022-09-02 NOTE — Patient Instructions (Signed)
Please continue Levothyroxine 50 mcg daily.  Take the thyroid hormone every day, with water, at least 30 minutes before breakfast, separated by at least 4 hours from: - acid reflux medications - calcium - iron - multivitamins  Please stop at the lab.  Please come back for a follow-up appointment in 1 year.

## 2022-09-24 ENCOUNTER — Other Ambulatory Visit (HOSPITAL_BASED_OUTPATIENT_CLINIC_OR_DEPARTMENT_OTHER): Payer: Self-pay | Admitting: Obstetrics & Gynecology

## 2022-09-24 ENCOUNTER — Inpatient Hospital Stay: Admission: RE | Admit: 2022-09-24 | Payer: No Typology Code available for payment source | Source: Ambulatory Visit

## 2022-09-24 ENCOUNTER — Ambulatory Visit: Payer: No Typology Code available for payment source

## 2022-09-24 DIAGNOSIS — E2839 Other primary ovarian failure: Secondary | ICD-10-CM

## 2022-11-09 ENCOUNTER — Ambulatory Visit
Admission: RE | Admit: 2022-11-09 | Discharge: 2022-11-09 | Disposition: A | Discharge status: Home / Self Care | Payer: 59 | Source: Ambulatory Visit | Attending: Obstetrics & Gynecology | Admitting: Obstetrics & Gynecology

## 2022-11-09 DIAGNOSIS — Z1231 Encounter for screening mammogram for malignant neoplasm of breast: Secondary | ICD-10-CM

## 2022-11-25 ENCOUNTER — Other Ambulatory Visit (HOSPITAL_COMMUNITY)
Admission: RE | Admit: 2022-11-25 | Discharge: 2022-11-25 | Disposition: A | Discharge status: Home / Self Care | Payer: 59 | Source: Ambulatory Visit | Attending: Obstetrics & Gynecology | Admitting: Obstetrics & Gynecology

## 2022-11-25 ENCOUNTER — Encounter (HOSPITAL_BASED_OUTPATIENT_CLINIC_OR_DEPARTMENT_OTHER): Payer: Self-pay | Admitting: Obstetrics & Gynecology

## 2022-11-25 ENCOUNTER — Ambulatory Visit (INDEPENDENT_AMBULATORY_CARE_PROVIDER_SITE_OTHER): Payer: 59 | Admitting: Obstetrics & Gynecology

## 2022-11-25 VITALS — BP 103/46 | HR 76 | Ht 63.5 in | Wt 118.2 lb

## 2022-11-25 DIAGNOSIS — Z860100 Personal history of colon polyps, unspecified: Secondary | ICD-10-CM | POA: Insufficient documentation

## 2022-11-25 DIAGNOSIS — Z780 Asymptomatic menopausal state: Secondary | ICD-10-CM | POA: Diagnosis not present

## 2022-11-25 DIAGNOSIS — Z9189 Other specified personal risk factors, not elsewhere classified: Secondary | ICD-10-CM | POA: Diagnosis not present

## 2022-11-25 DIAGNOSIS — E063 Autoimmune thyroiditis: Secondary | ICD-10-CM

## 2022-11-25 DIAGNOSIS — Z01419 Encounter for gynecological examination (general) (routine) without abnormal findings: Secondary | ICD-10-CM

## 2022-11-25 DIAGNOSIS — Z124 Encounter for screening for malignant neoplasm of cervix: Secondary | ICD-10-CM | POA: Insufficient documentation

## 2022-11-25 NOTE — Progress Notes (Signed)
62 y.o. [name] Married White or Caucasian female here for annual exam.  Doing well.  Denies vaginal bleeding.  Had colonoscopy done this year.  Will need follow up 3 years.  I do not have these results.  She is willing to sing a release.  Continues to have some insomnia issues.    Patient's last menstrual period was 08/26/2013.          Sexually active: Yes.    The current method of family planning is post menopausal status.    Smoker:  no  Health Maintenance: Pap:  10/2019 History of abnormal Pap:  no MMG:  11/10/2022 Colonoscopy:  done this year with Dr. Loreta Wallace.  Needs follow up 3 years. BMD:   scheduled for 03/2023 Screening Labs: hba1c and thyroid testing done with Dr. Elvera Wallace   reports that she has never smoked. She has never used smokeless tobacco. She reports that she does not drink alcohol and does not use drugs.  Past Medical History:  Diagnosis Date   BCC (basal cell carcinoma), face    forehead, stomach, legs, shoulders, arm   HSV-1 infection     Past Surgical History:  Procedure Laterality Date   CESAREAN SECTION  1/91   ENDOMETRIAL ABLATION  4/08   MOHS SURGERY  09/2011   BCC on forehead, (2021)under left eye & left ear   TONSILLECTOMY AND ADENOIDECTOMY     WISDOM TOOTH EXTRACTION      Current Outpatient Medications  Medication Sig Dispense Refill   Cholecalciferol (VITAMIN D PO) Take 2,000 Int'l Units by mouth daily.      fluticasone (FLONASE) 50 MCG/ACT nasal spray Place into both nostrils as needed.      levothyroxine (SYNTHROID) 50 MCG tablet TAKE 1 TABLET BY MOUTH EVERY DAY BEFORE BREAKFAST 30 tablet 11   No current facility-administered medications for this visit.    Family History  Problem Relation Age of Onset   Breast cancer Mother 28   Diabetes Mother        type II, AODM   Colon polyps Father    Thyroid disease Father    Hypertension Father    Stroke Father     ROS: Constitutional: negative Genitourinary:negative  Exam:   BP (!) 103/46  (BP Location: Right Arm, Patient Position: Sitting, Cuff Size: Large)   Pulse 76   Ht 5' 3.5" (1.613 m) Comment: Reported  Wt 118 lb 3.2 oz (53.6 kg)   LMP 08/26/2013 Comment: ablation 2008  BMI 20.61 kg/m   Height: 5' 3.5" (161.3 cm) (Reported)  General appearance: alert, cooperative and appears stated age Head: Normocephalic, without obvious abnormality, atraumatic Neck: no adenopathy, supple, symmetrical, trachea midline and thyroid normal to inspection and palpation Lungs: clear to auscultation bilaterally Breasts: normal appearance, no masses or tenderness Heart: regular rate and rhythm Abdomen: soft, non-tender; bowel sounds normal; no masses,  no organomegaly Extremities: extremities normal, atraumatic, no cyanosis or edema Skin: Skin color, texture, turgor normal. No rashes or lesions Lymph nodes: Cervical, supraclavicular, and axillary nodes normal. No abnormal inguinal nodes palpated Neurologic: Grossly normal   Pelvic: External genitalia:  no lesions              Urethra:  normal appearing urethra with no masses, tenderness or lesions              Bartholins and Skenes: normal                 Vagina: normal appearing vagina with normal color and  no discharge, no lesions              Cervix: no lesions              Pap taken: Yes.   Bimanual Exam:  Uterus:  normal size, contour, position, consistency, mobility, non-tender              Adnexa: normal adnexa and no mass, fullness, tenderness               Rectovaginal: Confirms               Anus:  normal sphincter tone, no lesions  Chaperone, Christina Wallace, CMA, was present for exam.  Assessment/Plan: 1. Well woman exam with routine gynecological exam - Pap smear obtained - Mammogram 11/10/2022 - Colonoscopy done this year.  Release will be signed.   - Bone mineral density scheduled for 03/2023 - lab work done last year and is done with Dr. Elvera Wallace - vaccines reviewed/updated   2. Cervical cancer screening -  Cytology - PAP( )  3. Increased risk of breast cancer - last abbreviated MRI was 2022.  Pt aware this not offered locally at this time  4. Hashimoto's thyroiditis - follow by Dr. Elvera Wallace  5. Postmenopausal - no HRT

## 2022-12-01 LAB — CYTOLOGY - PAP
Comment: NEGATIVE
Diagnosis: NEGATIVE
High risk HPV: NEGATIVE

## 2023-04-04 ENCOUNTER — Ambulatory Visit
Admission: RE | Admit: 2023-04-04 | Discharge: 2023-04-04 | Disposition: A | Discharge status: Home / Self Care | Payer: 59 | Source: Ambulatory Visit | Attending: Obstetrics & Gynecology | Admitting: Obstetrics & Gynecology

## 2023-04-04 DIAGNOSIS — E2839 Other primary ovarian failure: Secondary | ICD-10-CM

## 2023-04-13 ENCOUNTER — Encounter (HOSPITAL_BASED_OUTPATIENT_CLINIC_OR_DEPARTMENT_OTHER): Payer: Self-pay | Admitting: Obstetrics & Gynecology

## 2023-04-13 ENCOUNTER — Ambulatory Visit (HOSPITAL_BASED_OUTPATIENT_CLINIC_OR_DEPARTMENT_OTHER): Payer: 59 | Admitting: Obstetrics & Gynecology

## 2023-04-13 VITALS — BP 119/59 | HR 88 | Ht 63.0 in | Wt 118.0 lb

## 2023-04-13 DIAGNOSIS — M810 Age-related osteoporosis without current pathological fracture: Secondary | ICD-10-CM

## 2023-04-13 NOTE — Progress Notes (Signed)
Subjective:    78 yrs Married Caucasian [id number]  female here to discuss recent BMD obtained 04/04/2023 showing osteoporosis in her spine and hips.  T score in spine was -2.7 and in hips were -2.6 and -2.5.     Osteoporosis Risk Factors  Personal Hx of fracture as an adult: no Family hx of osteoporosis:  no Tobacco use: no Low body weight (<127 lbs): yes Estrogen deficiency with either early menopause (age <45) or bilateral ovariectomy: no Low calcium intake (lifelong): no Alcohol use more than 2 drinks per day: no Recurrent falls: no Inadequate physical activity: no.  Does treadmill/elliptical about 30 minutes and some toning exercises.    Current calcium and Vit D intake:  3000 international units  Vit D  Past Medical History:  Diagnosis Date   BCC (basal cell carcinoma), face    forehead, stomach, legs, shoulders, arm   HSV-1 infection    Current Outpatient Medications on File Prior to Visit  Medication Sig Dispense Refill   Cholecalciferol (VITAMIN D PO) Take 3,000 Int'l Units by mouth daily.     levothyroxine (SYNTHROID) 50 MCG tablet TAKE 1 TABLET BY MOUTH EVERY DAY BEFORE BREAKFAST 30 tablet 11   fluticasone (FLONASE) 50 MCG/ACT nasal spray Place into both nostrils as needed.  (Patient not taking: Reported on 04/13/2023)     No current facility-administered medications on file prior to visit.   Allergies  Allergen Reactions   Codeine Nausea And Vomiting   Sulfa Antibiotics Rash    Review of Systems Pertinent items noted in HPI and remainder of comprehensive ROS otherwise negative.     Objective:   PHYSICAL EXAM BP (!) 119/59   Pulse 88   Ht 5\' 3"  (1.6 m)   Wt 118 lb (53.5 kg)   LMP 08/26/2013 Comment: ablation 2008  BMI 20.90 kg/m  General appearance: alert and no distress  Imaging Bone Density: Spine T Score: -2.7, Hip T Score: -2.6 obtained 04/04/2023                                     Assessment:   Osteoporosis with T score -2.7 in spine measurement  and -2.6 in hip   Plan:   1.  Treatment briefly discussed.  She is not interested in medication at this time.    2.  Patient counseled in adequate calcium and vitamin D.  She is taking:  3000 IUVit D  3.  30 minutes of weight bearing exercise at least 3 times weekly recommended.  4.  Fall prevention discussed.  She is going to start PT as well as [facility].  She will call if needs referral.  5.  Lab work ordered:  PTH with calcium and Vit D  6.  Repeat BMD 2 years.    Total time with pt:  25

## 2023-04-14 ENCOUNTER — Encounter (HOSPITAL_BASED_OUTPATIENT_CLINIC_OR_DEPARTMENT_OTHER): Payer: Self-pay | Admitting: Obstetrics & Gynecology

## 2023-04-14 LAB — PTH, INTACT AND CALCIUM
Calcium: 9.8 mg/dL (ref 8.7–10.3)
PTH: 19 pg/mL (ref 15–65)

## 2023-04-14 LAB — VITAMIN D 25 HYDROXY (VIT D DEFICIENCY, FRACTURES): Vit D, 25-Hydroxy: 69.4 ng/mL (ref 30.0–100.0)

## 2023-09-02 ENCOUNTER — Ambulatory Visit: Payer: 59 | Admitting: Internal Medicine

## 2023-09-02 ENCOUNTER — Encounter: Payer: Self-pay | Admitting: Internal Medicine

## 2023-09-02 VITALS — BP 120/60 | HR 80 | Ht 63.0 in | Wt 121.0 lb

## 2023-09-02 DIAGNOSIS — E063 Autoimmune thyroiditis: Secondary | ICD-10-CM

## 2023-09-02 DIAGNOSIS — Z833 Family history of diabetes mellitus: Secondary | ICD-10-CM

## 2023-09-02 DIAGNOSIS — R7309 Other abnormal glucose: Secondary | ICD-10-CM

## 2023-09-02 NOTE — Progress Notes (Signed)
 Patient ID: KALE DOLS, female   DOB: [birth year], 63 y.o.   MRN: [id number]   HPI  NOELL LORENSEN is a 63 y.o.-year-old female, initially referred by her OB/GYN provider, Dr. Valentina Shaggy, now returning for follow-up for hypothyroidism due to Hashimoto's thyroiditis.  Last visit 1 year ago.  Interim history: She continues to have some fatigue which she attributes to not sleeping well.  This is a chronic problem for her. She also has occasional hot flashes, but not very bothersome. She feels that these are improving. She has hair thinning, also chronic.  Since last visit, she was diagnosed with osteoporosis.  PTH and calcium were normal.  She is not interested in starting medications.  She takes vitamin D 3000 units daily.  She increased her weightbearing exercises.  Reviewed history: Pt. has been found to have a slightly abnormal TSH in 10/2013.  However, the test normalized afterwards with level in the upper range of normal.  At last check with Dr. Hyacinth Meeker in 07/2018, TSH is increased to 7.18.  A free T4 level was also low 2 days later. She was referred to endocrinology at that time.   In  08/2018, her TSH was close to 10 and I suggested to start levothyroxine.  However, she wanted to have the TFTs repeated and another TSH obtained 3 months later was still high.   In 11/2018 we checked her antithyroid antibodies and they were positive, giving her a diagnosis of Hashimoto's thyroiditis.  She tried selenium supplement, gluten-free diet in an effort to avoid starting levothyroxine, however, with no changes in TFTs.   In 11/2018, she agreed to start levothyroxine, which she continues now at 25 mcg daily.  On this dose, TFTs normalized.  In 01/2020, we increased the levothyroxine dose to 50 mcg daily.  Pt takes levothyroxine: - in am - fasting - 45 min-2h from b'fast - no calcium - no iron - stopped multivitamins (very low iron, no calcium, 160 mcg biotin) - no PPIs  Reviewed her  TFTs: Lab Results  Component Value Date   TSH 2.53 09/02/2022   TSH 2.82 03/03/2022   TSH 2.50 08/27/2021   TSH 3.13 02/19/2021   TSH 4.23 08/22/2020   TSH 2.25 04/09/2020   TSH 4.66 (H) 02/22/2020   TSH 3.15 08/24/2019   TSH 4.18 04/18/2019   TSH 3.33 02/13/2019   FREET4 0.75 09/02/2022   FREET4 0.85 03/03/2022   FREET4 0.84 08/27/2021   FREET4 0.83 02/19/2021   FREET4 0.84 08/22/2020   FREET4 0.88 04/09/2020   FREET4 0.83 02/22/2020   FREET4 0.76 08/24/2019   FREET4 0.68 04/18/2019   FREET4 0.85 02/13/2019   T3FREE 3.1 02/13/2019   T3FREE 3.1 12/18/2018   T3FREE 3.9 09/18/2018    Reviewed previous antibody levels: Component     Latest Ref Rng & Units 12/18/2018  Thyroperoxidase Ab SerPl-aCnc     <9 IU/mL 293 (H)   Component     Latest Ref Rng & Units 09/18/2018  Thyroperoxidase Ab SerPl-aCnc     <9 IU/mL 353 (H)  Thyroglobulin Ab     < or = 1 IU/mL <1   Pt denies: - feeling nodules in neck - hoarseness - choking She has a history of eosinophilic esophagitis diagnosed by EGD in 2008.  She has occasional dysphagia.  She has + FH of thyroid disorders in: father (thyroid nodule), mother, sister, daughter - during pregnancy.  No family history of thyroid cancer.  No history of radiation therapy to  head or neck. No herbal supplements, steroids.  Pt. also has a history of basal cell tumors  - on Niacinamide. She is on vitamin D supplements. She exercises consistently, cardio and weights. She had R bursitis >> saw Dr. Katrinka Blazing >> had a steroid injection several years back, but no injections since then.  ROS: + see HPI  I reviewed pt's medications, allergies, PMH, social hx, family hx, and changes were documented in the history of present illness. Otherwise, unchanged from my initial visit note.  Past Medical History:  Diagnosis Date   BCC (basal cell carcinoma), face    forehead, stomach, legs, shoulders, arm   HSV-1 infection    Past Surgical History:   Procedure Laterality Date   CESAREAN SECTION  1/91   ENDOMETRIAL ABLATION  4/08   MOHS SURGERY  09/2011   BCC on forehead, (2021)under left eye & left ear   TONSILLECTOMY AND ADENOIDECTOMY     WISDOM TOOTH EXTRACTION     Social History   Socioeconomic History   Marital status: Married    Spouse name: Not on file   Number of children: 2   Years of education: Not on file   Highest education level: Not on file  Occupational History   Not on file  Social Needs   Financial resource strain: Not on file   Food insecurity:    Worry: Not on file    Inability: Not on file   Transportation needs:    Medical: Not on file    Non-medical: Not on file  Tobacco Use   Smoking status: Never Smoker   Smokeless tobacco: Never Used  Substance and Sexual Activity   Alcohol use: No    Frequency: Never   Drug use: No   Current Outpatient Medications on File Prior to Visit  Medication Sig Dispense Refill   Cholecalciferol (VITAMIN D PO) Take 3,000 Int'l Units by mouth daily.     fluticasone (FLONASE) 50 MCG/ACT nasal spray Place into both nostrils as needed.  (Patient not taking: Reported on 04/13/2023)     levothyroxine (SYNTHROID) 50 MCG tablet TAKE 1 TABLET BY MOUTH EVERY DAY BEFORE BREAKFAST 30 tablet 11   No current facility-administered medications on file prior to visit.   Allergies  Allergen Reactions   Codeine Nausea And Vomiting   Sulfa Antibiotics Rash   Family History  Problem Relation Age of Onset   Breast cancer Mother 73   Diabetes Mother        type II, AODM   Colon polyps Father    Thyroid disease Father    Hypertension Father    Stroke Father   Also, sister with HTN. Please also see HPI.  PE: BP 120/60   Pulse 80   Ht 5\' 3"  (1.6 m)   Wt 121 lb (54.9 kg)   LMP 08/26/2013 Comment: ablation 2008  SpO2 97%   BMI 21.43 kg/m  Wt Readings from Last 3 Encounters:  04/13/23 118 lb (53.5 kg)  11/25/22 118 lb 3.2 oz (53.6 kg)  09/02/22 118 lb (53.5 kg)    Constitutional: normal weight, in NAD Eyes:  EOMI, no exophthalmos ENT: no neck masses, no cervical lymphadenopathy Cardiovascular: tachycardia, RR, No MRG Respiratory: CTA B Musculoskeletal: no deformities Skin:no rashes Neurological: no tremor with outstretched hands  ASSESSMENT: 1. Hypothyroidism 2/2 Hashimoto's thyroiditis -She was on selenium in the past but she did not feel a difference, so we stopped it  2. Elevated HbA1c - FH of DM  PLAN:  1. Patient with history of hypothyroidism, diagnosed as due to Hashimoto's thyroiditis in 11/2019 her thyroid antibodies returned elevated.  She is on generic levothyroxine. - latest thyroid labs reviewed with pt. >> normal: Lab Results  Component Value Date   TSH 2.53 09/02/2022  - she continues on LT4 50 mcg daily - pt feels good on this dose.  She has some chronic complaints like insomnia, hair thinning, but we discussed that these are likely not related to the thyroid. - we discussed about taking the thyroid hormone every day, with water, >30 minutes before breakfast, separated by >4 hours from acid reflux medications, calcium, iron, multivitamins. Pt. is taking it correctly. - will check thyroid tests today: TSH and fT4 - If labs are abnormal, she will need to return for repeat TFTs in 1.5 months - OTW, RTC in 1 year  2. Elevated HbA1c -She has a family history of  diabetes; sister has GDM -She denies hypoglycemia symptoms likely increased urination, blurry vision, unintentional weight loss -She had an 11 pound baby more than 30 years ago -At last visit, HbA1c was stable, 5.9% -We will recheck this today -We again reviewed normal, prediabetic, and diabetic HbA1c ranges  Needs refills - 30 tablets at the time as she prefers to obtain them from the local pharmacy rather than mail order.  Orders Placed This Encounter  Procedures   TSH   T4, free   Hemoglobin A1c   Carlus Pavlov, MD PhD [facility] Endocrinology

## 2023-09-02 NOTE — Patient Instructions (Signed)
Please continue Levothyroxine 50 mcg daily.  Take the thyroid hormone every day, with water, at least 30 minutes before breakfast, separated by at least 4 hours from: - acid reflux medications - calcium - iron - multivitamins  Please stop at the lab.  Please come back for a follow-up appointment in 1 year. 

## 2023-09-03 ENCOUNTER — Encounter: Payer: Self-pay | Admitting: Internal Medicine

## 2023-09-03 LAB — HEMOGLOBIN A1C
Hgb A1c MFr Bld: 5.9 %{Hb} — ABNORMAL HIGH (ref <5.7)
Mean Plasma Glucose: 123 mg/dL
eAG (mmol/L): 6.8 mmol/L

## 2023-09-03 LAB — T4, FREE: Free T4: 1.3 ng/dL (ref 0.8–1.8)

## 2023-09-03 LAB — TSH: TSH: 1.77 m[IU]/L (ref 0.40–4.50)

## 2023-09-06 ENCOUNTER — Telehealth: Payer: Self-pay

## 2023-09-06 MED ORDER — LEVOTHYROXINE SODIUM 50 MCG PO TABS: ORAL_TABLET | ORAL | 11 refills | Status: AC

## 2023-09-06 NOTE — Telephone Encounter (Signed)
 Requested Prescriptions   Signed Prescriptions Disp Refills   levothyroxine (SYNTHROID) 50 MCG tablet 30 tablet 11    Sig: TAKE 1 TABLET BY MOUTH EVERY DAY BEFORE BREAKFAST    Authorizing Provider: Carlus Pavlov    Ordering User: Valorie Roosevelt S   3

## 2023-10-12 ENCOUNTER — Other Ambulatory Visit: Payer: Self-pay | Admitting: Obstetrics & Gynecology

## 2023-10-12 DIAGNOSIS — Z1231 Encounter for screening mammogram for malignant neoplasm of breast: Secondary | ICD-10-CM

## 2023-11-04 ENCOUNTER — Encounter (HOSPITAL_COMMUNITY): Payer: Self-pay

## 2023-11-11 ENCOUNTER — Ambulatory Visit
Admission: RE | Admit: 2023-11-11 | Discharge: 2023-11-11 | Disposition: A | Discharge status: Home / Self Care | Source: Ambulatory Visit

## 2023-11-11 DIAGNOSIS — Z1231 Encounter for screening mammogram for malignant neoplasm of breast: Secondary | ICD-10-CM

## 2023-12-09 ENCOUNTER — Encounter (HOSPITAL_BASED_OUTPATIENT_CLINIC_OR_DEPARTMENT_OTHER): Payer: Self-pay | Admitting: Obstetrics & Gynecology

## 2023-12-09 ENCOUNTER — Ambulatory Visit (HOSPITAL_BASED_OUTPATIENT_CLINIC_OR_DEPARTMENT_OTHER): Payer: 59 | Admitting: Obstetrics & Gynecology

## 2023-12-09 ENCOUNTER — Other Ambulatory Visit (HOSPITAL_COMMUNITY)
Admission: RE | Admit: 2023-12-09 | Discharge: 2023-12-09 | Disposition: A | Discharge status: Home / Self Care | Source: Ambulatory Visit | Attending: Obstetrics & Gynecology | Admitting: Obstetrics & Gynecology

## 2023-12-09 VITALS — BP 98/57 | HR 81 | Ht 63.0 in | Wt 117.0 lb

## 2023-12-09 DIAGNOSIS — Z01411 Encounter for gynecological examination (general) (routine) with abnormal findings: Secondary | ICD-10-CM | POA: Diagnosis not present

## 2023-12-09 DIAGNOSIS — N841 Polyp of cervix uteri: Secondary | ICD-10-CM

## 2023-12-09 DIAGNOSIS — N764 Abscess of vulva: Secondary | ICD-10-CM

## 2023-12-09 DIAGNOSIS — Z780 Asymptomatic menopausal state: Secondary | ICD-10-CM

## 2023-12-09 DIAGNOSIS — Z01419 Encounter for gynecological examination (general) (routine) without abnormal findings: Secondary | ICD-10-CM

## 2023-12-09 DIAGNOSIS — Z860100 Personal history of colon polyps, unspecified: Secondary | ICD-10-CM

## 2023-12-09 DIAGNOSIS — R7303 Prediabetes: Secondary | ICD-10-CM

## 2023-12-09 DIAGNOSIS — M810 Age-related osteoporosis without current pathological fracture: Secondary | ICD-10-CM

## 2023-12-09 DIAGNOSIS — E063 Autoimmune thyroiditis: Secondary | ICD-10-CM

## 2023-12-09 DIAGNOSIS — E7800 Pure hypercholesterolemia, unspecified: Secondary | ICD-10-CM

## 2023-12-09 DIAGNOSIS — Z9189 Other specified personal risk factors, not elsewhere classified: Secondary | ICD-10-CM | POA: Diagnosis not present

## 2023-12-09 MED ORDER — FLUCONAZOLE 150 MG PO TABS
150.0000 mg | ORAL_TABLET | Freq: Once | ORAL | 0 refills | Status: AC
Start: 2023-12-09 — End: 2023-12-09

## 2023-12-09 MED ORDER — DOXYCYCLINE HYCLATE 100 MG PO CAPS
100.0000 mg | ORAL_CAPSULE | Freq: Two times a day (BID) | ORAL | 0 refills | Status: AC
Start: 2023-12-09 — End: ?

## 2023-12-09 NOTE — Progress Notes (Signed)
 ANNUAL EXAM Patient name: Christina Wallace MRN [id number]  Date of birth: Nov 24, 1960 Chief Complaint:   Annual Exam  History of Present Illness:   Christina Wallace is a 63 y.o. G60P0012 Caucasian female being seen today for a routine annual exam. Has a vulvar lesion she wants my opinion about today.  Having some issues infection in nose and ear.    Denies vaginal bleeding.     Patient's last menstrual period was 08/26/2013.    Last pap 11/25/2022  . Results were: NILM w/ HRHPV negative. H/O abnormal pap: no Last mammogram: 11/11/2023  . Results were: normal. Family h/o breast cancer: yes mother Last colonoscopy: 11/08/2022 . Results were: normal. Family h/o colorectal cancer: no     11/25/2022   10:15 AM 11/17/2021    9:48 AM  Depression screen PHQ 2/9  Decreased Interest 0 0  Down, Depressed, Hopeless 0 0  PHQ - 2 Score 0 0     Review of Systems:   Pertinent items are noted in HPI Denies any urinary or bowel changes.  No pelvic pain. Pertinent History Reviewed:  Reviewed past medical,surgical, social and family history.  Reviewed problem list, medications and allergies. Physical Assessment:   Vitals:   12/09/23 0916  BP: (!) 98/57  Pulse: 81  Weight: 117 lb (53.1 kg)  Height: 5' 3 (1.6 m)  Body mass index is 20.73 kg/m.        Physical Examination:   General appearance - well appearing, and in no distress  Mental status - alert, oriented to person, place, and time  Psych:  She has a normal mood and affect  Skin - warm and dry, normal color, no suspicious lesions noted  Chest - effort normal, all lung fields clear to auscultation bilaterally  Heart - normal rate and regular rhythm  Neck:  midline trachea, no thyromegaly or nodules  Breasts - breasts appear normal, no suspicious masses, no skin or nipple changes or  axillary nodes  Abdomen - soft, nontender, nondistended, no masses or organomegaly  Pelvic - VULVA: normal appearing vulva with no masses,left outer  labial furuncle noted, drainage present, surrounding erythema noted.  Lesion about 2cm.    VAGINA: normal appearing vagina with normal color and discharge, no lesions   CERVIX: normal appearing cervix without discharge or lesions, no CMT  Thin prep pap is not done but cervical polyp noted  UTERUS: uterus is felt to be normal size, shape, consistency and nontender   ADNEXA: No adnexal masses or tenderness noted.  Rectal - normal rectal, good sphincter tone, no masses felt  Extremities:  No swelling or varicosities noted  Procedure:  polyp removal recommended.  Polyp grasped with ringed forceps and twisted at base until removed completely.  Minimal bleeding noted.  Will send for pathology.   Chaperone present for exam  Assessment & Plan:  1. Well woman exam with routine gynecological exam (Primary) - Pap smear 11/25/2022 - Mammogram 11/11/2023 - Colonoscopy 11/08/2022 - Bone mineral density 03/2023 - lab work per below - vaccines reviewed/updated  2. Increased risk of breast cancer - considering screening MRI this year if can find location that will do this  3. History of colonic polyps - will need to repeat 3 years  4. Postmenopausal  5. Elevated LDL cholesterol level - Lipid panel  6. Prediabetes - Hemoglobin A1c - Comprehensive metabolic panel with GFR  7. Hashimoto's thyroiditis - TSH  8. Cervical polyp - Surgical pathology( [place]/ [facility])  9.  Vulvar furuncle - doxycycline (VIBRAMYCIN) 100 MG capsule; Take 1 capsule (100 mg total) by mouth 2 (two) times daily. Take with food as can cause GI distress.  Dispense: 14 capsule; Refill: 0 - fluconazole (DIFLUCAN) 150 MG tablet; Take 1 tablet (150 mg total) by mouth once for 1 dose.  Dispense: 1 tablet; Refill: 0 - WOUND CULTURE  10. Age-related osteoporosis without current pathological fracture - on Vit D and calcium.  Doing weight bearing exercise.  Plan to repeat next year.     Meds:  Meds ordered this  encounter  Medications   doxycycline (VIBRAMYCIN) 100 MG capsule    Sig: Take 1 capsule (100 mg total) by mouth 2 (two) times daily. Take with food as can cause GI distress.    Dispense:  14 capsule    Refill:  0   fluconazole (DIFLUCAN) 150 MG tablet    Sig: Take 1 tablet (150 mg total) by mouth once for 1 dose.    Dispense:  1 tablet    Refill:  0    Follow-up: No follow-ups on file.  Lillian Rein, MD 12/09/2023 4:29 PM

## 2023-12-10 LAB — COMPREHENSIVE METABOLIC PANEL WITH GFR
ALT: 19 IU/L (ref 0–32)
AST: 22 IU/L (ref 0–40)
Albumin: 4.7 g/dL (ref 3.9–4.9)
Alkaline Phosphatase: 75 IU/L (ref 44–121)
BUN/Creatinine Ratio: 32 — ABNORMAL HIGH (ref 12–28)
BUN: 21 mg/dL (ref 8–27)
Bilirubin Total: 0.5 mg/dL (ref 0.0–1.2)
CO2: 26 mmol/L (ref 20–29)
Calcium: 9.5 mg/dL (ref 8.7–10.3)
Chloride: 102 mmol/L (ref 96–106)
Creatinine, Ser: 0.66 mg/dL (ref 0.57–1.00)
Globulin, Total: 2 g/dL (ref 1.5–4.5)
Glucose: 89 mg/dL (ref 70–99)
Potassium: 4.4 mmol/L (ref 3.5–5.2)
Sodium: 141 mmol/L (ref 134–144)
Total Protein: 6.7 g/dL (ref 6.0–8.5)
eGFR: 100 mL/min/{1.73_m2} (ref >59)

## 2023-12-10 LAB — HEMOGLOBIN A1C
Est. average glucose Bld gHb Est-mCnc: 117 mg/dL
Hgb A1c MFr Bld: 5.7 % — ABNORMAL HIGH (ref 4.8–5.6)

## 2023-12-10 LAB — LIPID PANEL
Chol/HDL Ratio: 2.4 ratio (ref 0.0–4.4)
Cholesterol, Total: 165 mg/dL (ref 100–199)
HDL: 68 mg/dL (ref >39)
LDL Chol Calc (NIH): 89 mg/dL (ref 0–99)
Triglycerides: 37 mg/dL (ref 0–149)
VLDL Cholesterol Cal: 8 mg/dL (ref 5–40)

## 2023-12-10 LAB — TSH: TSH: 2.85 u[IU]/mL (ref 0.450–4.500)

## 2023-12-12 LAB — SURGICAL PATHOLOGY

## 2023-12-13 ENCOUNTER — Ambulatory Visit (HOSPITAL_BASED_OUTPATIENT_CLINIC_OR_DEPARTMENT_OTHER): Payer: Self-pay | Admitting: Obstetrics & Gynecology

## 2023-12-13 LAB — WOUND CULTURE: Organism ID, Bacteria: NONE SEEN

## 2024-02-21 ENCOUNTER — Telehealth (HOSPITAL_BASED_OUTPATIENT_CLINIC_OR_DEPARTMENT_OTHER): Payer: Self-pay | Admitting: Obstetrics & Gynecology

## 2024-02-21 DIAGNOSIS — Z9189 Other specified personal risk factors, not elsewhere classified: Secondary | ICD-10-CM

## 2024-02-21 NOTE — Telephone Encounter (Addendum)
 Patient was seen with Dr.Miller on 12/09/2023 per note patient to consider breast MRI this year. M/R Breast W & WO CM Screening for increased risk of breast cancer ordered to [facility] at [facility]. Will need precert. Routing to [employer] for assist with pre certification.

## 2024-02-21 NOTE — Telephone Encounter (Signed)
 New message:  The patient is calling to request that an MRI order be sent to [facility] on [address].

## 2024-04-03 ENCOUNTER — Encounter: Payer: Self-pay | Admitting: Podiatry

## 2024-04-03 ENCOUNTER — Ambulatory Visit: Admitting: Podiatry

## 2024-04-03 DIAGNOSIS — B351 Tinea unguium: Secondary | ICD-10-CM | POA: Diagnosis not present

## 2024-04-03 NOTE — Progress Notes (Signed)
  Subjective:  Patient ID: Christina Wallace, female    DOB: 1960/11/21,   MRN: [id number]  Chief Complaint  Patient presents with   Nail Problem    The right big toe has been discolored.  Last week when I was cutting my toenail, liquid came out of it.  I think I injured it on the tip of it last week when I feel on top of what was already going on.  I've seen a little tinge of blood.  It's been going on for way over a month.    63 y.o. female presents for concern as above. Denies any treatments.  . Denies any other pedal complaints. Denies n/v/f/c.   Past Medical History:  Diagnosis Date   BCC (basal cell carcinoma), face    forehead, stomach, legs, shoulders, arm   HSV-1 infection     Objective:  Physical Exam: Vascular: DP/PT pulses 2/4 bilateral. CFT <3 seconds. Normal hair growth on digits. No edema.  Skin. No lacerations or abrasions bilateral feet. Right hallux nail distally thickened and dystrophic with serous drainage underlying and seaparation of nail plate from nail bed.  Musculoskeletal: MMT 5/5 bilateral lower extremities in DF, PF, Inversion and Eversion. Deceased ROM in DF of ankle joint.  Neurological: Sensation intact to light touch.   Assessment:   1. Onychomycosis of toenail      Plan:  Patient was evaluated and treated and all questions answered. -Examined patient -Discussed treatment options for painful dystrophic nails  -Clinical picture and Fungal culture was obtained by removing a portion of the hard nail itself from each of the involved toenails using a sterile nail nipper and sent to [facility] lab. Patient tolerated the biopsy procedure well without discomfort or need for anesthesia.  -Discussed fungal nail treatment options including oral, topical, and laser treatments.  -Patient to return in 4 weeks for follow up evaluation and discussion of fungal culture results or sooner if symptoms worsen.   Asberry Failing, DPM

## 2024-04-10 ENCOUNTER — Ambulatory Visit
Admission: RE | Admit: 2024-04-10 | Discharge: 2024-04-10 | Disposition: A | Discharge status: Home / Self Care | Payer: Self-pay | Source: Ambulatory Visit | Attending: Obstetrics & Gynecology | Admitting: Obstetrics & Gynecology

## 2024-04-10 DIAGNOSIS — Z9189 Other specified personal risk factors, not elsewhere classified: Secondary | ICD-10-CM

## 2024-04-10 MED ORDER — GADOPICLENOL 0.5 MMOL/ML IV SOLN
5.0000 mL | Freq: Once | INTRAVENOUS | Status: AC | PRN
  Administered 2024-04-10: 5 mL via INTRAVENOUS

## 2024-04-12 ENCOUNTER — Ambulatory Visit (HOSPITAL_BASED_OUTPATIENT_CLINIC_OR_DEPARTMENT_OTHER): Payer: Self-pay | Admitting: Obstetrics & Gynecology

## 2024-04-19 ENCOUNTER — Other Ambulatory Visit: Payer: Self-pay | Admitting: Podiatry

## 2024-05-03 ENCOUNTER — Ambulatory Visit: Admitting: Podiatry

## 2024-05-03 ENCOUNTER — Encounter: Payer: Self-pay | Admitting: Podiatry

## 2024-05-03 DIAGNOSIS — B351 Tinea unguium: Secondary | ICD-10-CM | POA: Diagnosis not present

## 2024-05-03 DIAGNOSIS — L600 Ingrowing nail: Secondary | ICD-10-CM

## 2024-05-03 NOTE — Progress Notes (Signed)
  Subjective:  Patient ID: Christina Wallace, female    DOB: 04-10-1961,   MRN: [id number]  Chief Complaint  Patient presents with   Nail Problem    It's a follow-up appointment from October, Candida, Fungal infection.    63 y.o. female presents for follow-up of fungal nail and to review culture results.  She relates she has been some research on intake of fungus patches, and would like to consider removal of the nail.. Denies any other pedal complaints. Denies n/v/f/c.   Past Medical History:  Diagnosis Date   BCC (basal cell carcinoma), face    forehead, stomach, legs, shoulders, arm   HSV-1 infection     Objective:  Physical Exam: Vascular: DP/PT pulses 2/4 bilateral. CFT <3 seconds. Normal hair growth on digits. No edema.  Skin. No lacerations or abrasions bilateral feet. Right hallux nail distally thickened and dystrophic no drainage noted today. Tender to palpation.  Musculoskeletal: MMT 5/5 bilateral lower extremities in DF, PF, Inversion and Eversion. Deceased ROM in DF of ankle joint.  Neurological: Sensation intact to light touch.   Assessment:   1. Onychomycosis of toenail   2. Ingrown right greater toenail       Plan:  Patient was evaluated and treated and all questions answered. -Examined patient -Discussed treatment options for painful dystrophic nails  - Culture positive for yeast forms. -Discussed fungal nail treatment options including oral, topical, and laser treatments.  - Patient would like to try removal of the toenail.  Procedure below. Discussed ingrown toenails etiology and treatment options including procedure for removal vs conservative care.  Patient requesting removal of ingrown nail today. Procedure below.  Discussed procedure and post procedure care and patient expressed understanding.   Follow-up in 3 months for recheck of nail growth and to assess for fungal presents   Procedure:  Procedure: total Nail Avulsion of right hallux  nail Surgeon: Asberry Failing, DPM  Pre-op Dx: Ingrown toenail with infection Post-op: Same  Place of Surgery: Office exam room.  Indications for surgery: Painful and ingrown toenail.    The patient is requesting removal of nail without  chemical matrixectomy. Risks and complications were discussed with the patient for which they understand and written consent was obtained. Under sterile conditions a total of 3 mL of  1% lidocaine plain was infiltrated in a hallux block fashion. Once anesthetized, the skin was prepped in sterile fashion. A tourniquet was then applied. Next the entire hallux nail on the right was removed.  Area copiously irrigated. Silvadene was applied. A dry sterile dressing was applied. After application of the dressing the tourniquet was removed and there is found to be an immediate capillary refill time to the digit. The patient tolerated the procedure well without any complications. Post procedure instructions were discussed the patient for which he verbally understood. Follow-up in two weeks for nail check or sooner if any problems are to arise. Discussed signs/symptoms of infection and directed to call the office immediately should any occur or go directly to the emergency room. In the meantime, encouraged to call the office with any questions, concerns, changes symptoms.    Asberry Failing, DPM

## 2024-05-03 NOTE — Patient Instructions (Signed)

## 2024-08-03 ENCOUNTER — Encounter: Payer: Self-pay | Admitting: Podiatry

## 2024-08-03 ENCOUNTER — Ambulatory Visit: Admitting: Podiatry

## 2024-08-03 DIAGNOSIS — B351 Tinea unguium: Secondary | ICD-10-CM

## 2024-08-03 MED ORDER — FLUCONAZOLE 150 MG PO TABS: 150.0000 mg | ORAL_TABLET | ORAL | 0 refills | Status: AC

## 2024-08-03 NOTE — Progress Notes (Signed)
"  °  Subjective:  Patient ID: Christina Wallace, female    DOB: [birth year],   MRN: [id number]  Chief Complaint  Patient presents with   Nail Problem    I think it's okay.  It's bumpy feeling.  I would like her to look at my other foot to see if it has spread?    64 y.o. female presents for follow-up of fungal nailAnd removal of right hallux nail.  She relates doing okay.  She does notice some bumpy changes on the nail that she was concerned about and worried about possible spread. Denies any other pedal complaints. Denies n/v/f/c.   Past Medical History:  Diagnosis Date   BCC (basal cell carcinoma), face    forehead, stomach, legs, shoulders, arm   HSV-1 infection     Objective:  Physical Exam: Vascular: DP/PT pulses 2/4 bilateral. CFT <3 seconds. Normal hair growth on digits. No edema.  Skin. No lacerations or abrasions bilateral feet.  Right hallux nail about one third regrowth noted with clearing and no major changes noted.  Some small darkening changes noted to lesser digits on the left foot Musculoskeletal: MMT 5/5 bilateral lower extremities in DF, PF, Inversion and Eversion. Deceased ROM in DF of ankle joint.  Neurological: Sensation intact to light touch.   Assessment:   1. Onychomycosis of toenail        Plan:  Patient was evaluated and treated and all questions answered. -Examined patient -Discussed treatment options for painful dystrophic nails  - Culture positive for yeast forms. -Discussed fungal nail treatment options including oral, topical, and laser treatments.  - Right hallux nail healing well and ingrowing free of fungus currently. -Discussed some concern of reoccurrence of fungus on the left foot.  Given previous culture of yeast will try 2 weeks of Diflucan  to see if we can control this.  Follow-up in as needed if any worsening or changes noted.    Asberry Failing, DPM    "

## 2024-09-04 ENCOUNTER — Ambulatory Visit: Admitting: Internal Medicine

## 2024-12-17 ENCOUNTER — Ambulatory Visit (HOSPITAL_BASED_OUTPATIENT_CLINIC_OR_DEPARTMENT_OTHER): Admitting: Obstetrics & Gynecology
